# Patient Record
Sex: Male | Born: 1990 | Race: White | Hispanic: No | Marital: Single | Smoking: Current every day smoker
Health system: Southern US, Community
[De-identification: ages and names within clinical notes are randomized; demographics above are authoritative.]

## PROBLEM LIST (undated history)

## (undated) DIAGNOSIS — R569 Unspecified convulsions: Secondary | ICD-10-CM

## (undated) DIAGNOSIS — I10 Essential (primary) hypertension: Secondary | ICD-10-CM

---
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
## 2011-03-26 ENCOUNTER — Encounter (HOSPITAL_COMMUNITY): Payer: Self-pay | Admitting: Emergency Medicine

## 2011-03-26 ENCOUNTER — Emergency Department (HOSPITAL_COMMUNITY): Payer: BC Managed Care – PPO

## 2011-03-26 ENCOUNTER — Emergency Department (HOSPITAL_COMMUNITY)
Admission: EM | Admit: 2011-03-26 | Discharge: 2011-03-26 | Disposition: A | Discharge status: Home / Self Care | Payer: BC Managed Care – PPO | Attending: Emergency Medicine | Admitting: Emergency Medicine

## 2011-03-26 DIAGNOSIS — R569 Unspecified convulsions: Secondary | ICD-10-CM | POA: Insufficient documentation

## 2011-03-26 DIAGNOSIS — F29 Unspecified psychosis not due to a substance or known physiological condition: Secondary | ICD-10-CM | POA: Insufficient documentation

## 2011-03-26 DIAGNOSIS — R404 Transient alteration of awareness: Secondary | ICD-10-CM | POA: Insufficient documentation

## 2011-03-26 LAB — CBC
HCT: 48.2 % (ref 39.0–52.0)
Hemoglobin: 16.9 g/dL (ref 13.0–17.0)
RBC: 5.47 MIL/uL (ref 4.22–5.81)

## 2011-03-26 LAB — URINALYSIS, ROUTINE W REFLEX MICROSCOPIC
Glucose, UA: NEGATIVE mg/dL
Ketones, ur: NEGATIVE mg/dL
Leukocytes, UA: NEGATIVE
Protein, ur: NEGATIVE mg/dL
Urobilinogen, UA: 1 mg/dL (ref 0.0–1.0)

## 2011-03-26 LAB — COMPREHENSIVE METABOLIC PANEL
Albumin: 4 g/dL (ref 3.5–5.2)
Alkaline Phosphatase: 57 U/L (ref 39–117)
BUN: 8 mg/dL (ref 6–23)
CO2: 27 mEq/L (ref 19–32)
Chloride: 104 mEq/L (ref 96–112)
GFR calc Af Amer: >90 mL/min (ref >90)
GFR calc non Af Amer: >90 mL/min (ref >90)
Glucose, Bld: 79 mg/dL (ref 70–99)
Potassium: 4 mEq/L (ref 3.5–5.1)
Total Bilirubin: 0.8 mg/dL (ref 0.3–1.2)

## 2011-03-26 LAB — DIFFERENTIAL
Lymphocytes Relative: 7 % — ABNORMAL LOW (ref 12–46)
Lymphs Abs: 1.1 10*3/uL (ref 0.7–4.0)
Monocytes Absolute: 1.2 10*3/uL — ABNORMAL HIGH (ref 0.1–1.0)
Monocytes Relative: 8 % (ref 3–12)
Neutro Abs: 13.7 10*3/uL — ABNORMAL HIGH (ref 1.7–7.7)
Neutrophils Relative %: 84 % — ABNORMAL HIGH (ref 43–77)

## 2011-03-26 LAB — ETHANOL: Alcohol, Ethyl (B): <11 mg/dL (ref 0–11)

## 2011-03-26 LAB — RAPID URINE DRUG SCREEN, HOSP PERFORMED
Amphetamines: NOT DETECTED
Tetrahydrocannabinol: POSITIVE — AB

## 2011-03-26 NOTE — ED Notes (Signed)
Patient states that he is ready to go home. Denies pain.

## 2011-03-26 NOTE — ED Provider Notes (Signed)
History     CSN: [id number] Arrival date & time: 03/26/2011  2:52 PM   First MD Initiated Contact with Patient 03/26/11 1518      Chief Complaint  Patient presents with  . Seizures    (Consider location/radiation/quality/duration/timing/severity/associated sxs/prior treatment) HPI Comments: Was raking leaves at work, then suddenly went into a seizure.  No bowel or bladder incontinence.  No biting of tongue or cheek.  Patient is a 20 y.o. male presenting with seizures. The history is provided by the patient.  Seizures  This is a new problem. The current episode started less than 1 hour ago. The problem has been resolved. There was 1 seizure. The most recent episode lasted 2 to 5 minutes. Associated symptoms include confusion. Pertinent negatives include no headaches, no neck stiffness, no chest pain and no cough. Characteristics include rhythmic jerking and loss of consciousness. Characteristics do not include bladder incontinence. The episode was witnessed. There was no sensation of an aura present. The seizures did not continue in the ED. The seizure(s) had no focality. There has been no fever.    History reviewed. No pertinent past medical history.  No past surgical history on file.  No family history on file.  History  Substance Use Topics  . Smoking status: Not on file  . Smokeless tobacco: Not on file  . Alcohol Use: Not on file      Review of Systems  Respiratory: Negative for cough.   Cardiovascular: Negative for chest pain.  Genitourinary: Negative for bladder incontinence.  Neurological: Positive for seizures and loss of consciousness. Negative for headaches.  Psychiatric/Behavioral: Positive for confusion.  All other systems reviewed and are negative.    Allergies  Review of patient's allergies indicates no known allergies.  Home Medications  No current outpatient prescriptions on file.  BP 112/74  Pulse 105  Resp 22  SpO2 100%  Physical Exam    Nursing note and vitals reviewed. Constitutional: He is oriented to person, place, and time. He appears well-developed and well-nourished. No distress.  HENT:  Head: Normocephalic and atraumatic.  Right Ear: External ear normal.  Mouth/Throat: No oropharyngeal exudate.       No lacs on tongue or cheek.  Eyes: EOM are normal. Pupils are equal, round, and reactive to light.  Neck: Normal range of motion. Neck supple.  Cardiovascular: Normal rate and regular rhythm.  Exam reveals no gallop and no friction rub.   No murmur heard. Pulmonary/Chest: Effort normal and breath sounds normal. No respiratory distress.  Abdominal: Soft. Bowel sounds are normal. There is no tenderness.  Musculoskeletal: Normal range of motion.  Lymphadenopathy:    He has no cervical adenopathy.  Neurological: He is alert and oriented to person, place, and time. No cranial nerve deficit. Coordination normal.  Skin: Skin is warm and dry. He is not diaphoretic.    ED Course  Procedures (including critical care time)   Labs Reviewed  CBC  DIFFERENTIAL  COMPREHENSIVE METABOLIC PANEL  URINALYSIS, ROUTINE W REFLEX MICROSCOPIC  URINE RAPID DRUG SCREEN (HOSP PERFORMED)  ETHANOL   No results found.   No diagnosis found.    MDM  Patient with first time seizure.  Labs, CT okay.  Spoke with [facility] and they agree to follow him up in the next few days.        Geoffery Lyons, MD 03/26/11 [id number]

## 2011-03-26 NOTE — ED Notes (Signed)
Per EMS:  Pt here s/p seizure.  Pt was at work raking leaves when he was described as lying on the ground having a full body seizure.  Witnesses report activity lasted 2-3 minutes.  Upon arrival of ems pt was alert and confused.  Upon arrival here pt is now alert and oriented x4.  Pt has no hx of seizure.  Pt has abrasion to left elbow.

## 2011-04-03 LAB — THC (MARIJUANA), URINE, CONFIRMATION: Marijuana, Ur-Confirmation: 595 NG/ML — ABNORMAL HIGH

## 2012-07-10 ENCOUNTER — Emergency Department: Payer: Self-pay | Admitting: Emergency Medicine

## 2012-07-10 LAB — CBC
HCT: 56.6 % — ABNORMAL HIGH (ref 40.0–52.0)
HGB: 18.4 g/dL — ABNORMAL HIGH (ref 13.0–18.0)
RBC: 6.27 10*6/uL — ABNORMAL HIGH (ref 4.40–5.90)
RDW: 13.5 % (ref 11.5–14.5)
WBC: 14.2 10*3/uL — ABNORMAL HIGH (ref 3.8–10.6)

## 2012-07-10 LAB — BASIC METABOLIC PANEL
EGFR (African American): >60
Osmolality: 275 (ref 275–301)
Potassium: 3.6 mmol/L (ref 3.5–5.1)

## 2012-07-10 LAB — URINALYSIS, COMPLETE
Glucose,UR: NEGATIVE mg/dL (ref 0–75)
Specific Gravity: 1.003 (ref 1.003–1.030)

## 2012-09-28 ENCOUNTER — Emergency Department: Payer: Self-pay | Admitting: Emergency Medicine

## 2012-09-28 LAB — URINALYSIS, COMPLETE
Bacteria: NONE SEEN
Bilirubin,UR: NEGATIVE
Blood: NEGATIVE
Ketone: NEGATIVE
Leukocyte Esterase: NEGATIVE
Nitrite: NEGATIVE
Protein: NEGATIVE
RBC,UR: <1 /HPF (ref 0–5)
Squamous Epithelial: NONE SEEN
WBC UR: 1 /HPF (ref 0–5)

## 2012-09-28 LAB — BASIC METABOLIC PANEL
Anion Gap: 3 — ABNORMAL LOW (ref 7–16)
Chloride: 104 mmol/L (ref 98–107)
Creatinine: 0.82 mg/dL (ref 0.60–1.30)
EGFR (African American): >60
Potassium: 4.3 mmol/L (ref 3.5–5.1)

## 2012-09-28 LAB — CBC
HGB: 17.7 g/dL (ref 13.0–18.0)
MCV: 88 fL (ref 80–100)
RDW: 14.5 % (ref 11.5–14.5)
WBC: 8.2 10*3/uL (ref 3.8–10.6)

## 2013-04-24 ENCOUNTER — Emergency Department: Payer: Self-pay | Admitting: Emergency Medicine

## 2013-05-01 ENCOUNTER — Emergency Department: Payer: Self-pay | Admitting: Emergency Medicine

## 2013-06-15 ENCOUNTER — Emergency Department: Payer: Self-pay | Admitting: Emergency Medicine

## 2013-06-19 ENCOUNTER — Emergency Department: Payer: Self-pay | Admitting: Emergency Medicine

## 2013-06-19 LAB — CBC WITH DIFFERENTIAL/PLATELET
BASOS PCT: 0.8 %
Basophil #: 0.1 10*3/uL (ref 0.0–0.1)
EOS PCT: 0.9 %
Eosinophil #: 0.1 10*3/uL (ref 0.0–0.7)
HCT: 48.9 % (ref 40.0–52.0)
HGB: 16.5 g/dL (ref 13.0–18.0)
LYMPHS PCT: 9.6 %
Lymphocyte #: 0.9 10*3/uL — ABNORMAL LOW (ref 1.0–3.6)
MCH: 29.6 pg (ref 26.0–34.0)
MCHC: 33.8 g/dL (ref 32.0–36.0)
MCV: 87 fL (ref 80–100)
Monocyte #: 0.9 x10 3/mm (ref 0.2–1.0)
Monocyte %: 10.2 %
NEUTROS PCT: 78.5 %
Neutrophil #: 7.3 10*3/uL — ABNORMAL HIGH (ref 1.4–6.5)
Platelet: 215 10*3/uL (ref 150–440)
RBC: 5.59 10*6/uL (ref 4.40–5.90)
RDW: 13.6 % (ref 11.5–14.5)
WBC: 9.3 10*3/uL (ref 3.8–10.6)

## 2013-06-19 LAB — URINALYSIS, COMPLETE
BILIRUBIN, UR: NEGATIVE
BLOOD: NEGATIVE
Bacteria: NONE SEEN
Glucose,UR: NEGATIVE mg/dL (ref 0–75)
Hyaline Cast: 1
Ketone: NEGATIVE
LEUKOCYTE ESTERASE: NEGATIVE
NITRITE: NEGATIVE
Ph: 9 (ref 4.5–8.0)
RBC,UR: NONE SEEN /HPF (ref 0–5)
Specific Gravity: 1 (ref 1.003–1.030)
WBC UR: <1 /HPF (ref 0–5)

## 2013-06-19 LAB — LIPASE, BLOOD: Lipase: 56 U/L — ABNORMAL LOW (ref 73–393)

## 2013-06-19 LAB — COMPREHENSIVE METABOLIC PANEL
ALBUMIN: 3.8 g/dL (ref 3.4–5.0)
ALT: 17 U/L (ref 12–78)
ANION GAP: 5 — AB (ref 7–16)
AST: 10 U/L — AB (ref 15–37)
Alkaline Phosphatase: 61 U/L
BILIRUBIN TOTAL: 0.6 mg/dL (ref 0.2–1.0)
BUN: 8 mg/dL (ref 7–18)
CO2: 30 mmol/L (ref 21–32)
CREATININE: 0.74 mg/dL (ref 0.60–1.30)
Calcium, Total: 9.3 mg/dL (ref 8.5–10.1)
Chloride: 103 mmol/L (ref 98–107)
EGFR (African American): >60
Glucose: 101 mg/dL — ABNORMAL HIGH (ref 65–99)
Osmolality: 274 (ref 275–301)
Potassium: 4 mmol/L (ref 3.5–5.1)
Sodium: 138 mmol/L (ref 136–145)
TOTAL PROTEIN: 7.4 g/dL (ref 6.4–8.2)

## 2013-06-20 LAB — WOUND CULTURE

## 2013-06-22 ENCOUNTER — Emergency Department: Payer: Self-pay | Admitting: Emergency Medicine

## 2013-06-27 ENCOUNTER — Emergency Department: Payer: Self-pay | Admitting: Internal Medicine

## 2013-06-27 LAB — COMPREHENSIVE METABOLIC PANEL
ALK PHOS: 62 U/L
ALT: 13 U/L (ref 12–78)
ANION GAP: 4 — AB (ref 7–16)
AST: 16 U/L (ref 15–37)
Albumin: 3 g/dL — ABNORMAL LOW (ref 3.4–5.0)
BUN: 9 mg/dL (ref 7–18)
Bilirubin,Total: 0.3 mg/dL (ref 0.2–1.0)
CHLORIDE: 105 mmol/L (ref 98–107)
CO2: 28 mmol/L (ref 21–32)
Calcium, Total: 8.4 mg/dL — ABNORMAL LOW (ref 8.5–10.1)
Creatinine: 0.9 mg/dL (ref 0.60–1.30)
EGFR (African American): >60
EGFR (Non-African Amer.): >60
GLUCOSE: 81 mg/dL (ref 65–99)
Osmolality: 272 (ref 275–301)
Potassium: 4 mmol/L (ref 3.5–5.1)
Sodium: 137 mmol/L (ref 136–145)
Total Protein: 6.7 g/dL (ref 6.4–8.2)

## 2013-06-27 LAB — LIPASE, BLOOD: LIPASE: 51 U/L — AB (ref 73–393)

## 2013-06-27 LAB — CBC WITH DIFFERENTIAL/PLATELET
Basophil #: 0 10*3/uL (ref 0.0–0.1)
Basophil %: 0.4 %
Eosinophil #: 0.6 10*3/uL (ref 0.0–0.7)
Eosinophil %: 8.4 %
HCT: 44.8 % (ref 40.0–52.0)
HGB: 15.5 g/dL (ref 13.0–18.0)
Lymphocyte #: 0.7 10*3/uL — ABNORMAL LOW (ref 1.0–3.6)
Lymphocyte %: 9.8 %
MCH: 29.7 pg (ref 26.0–34.0)
MCHC: 34.6 g/dL (ref 32.0–36.0)
MCV: 86 fL (ref 80–100)
MONO ABS: 0.6 x10 3/mm (ref 0.2–1.0)
Monocyte %: 9.1 %
NEUTROS ABS: 4.8 10*3/uL (ref 1.4–6.5)
NEUTROS PCT: 72.3 %
Platelet: 200 10*3/uL (ref 150–440)
RBC: 5.22 10*6/uL (ref 4.40–5.90)
RDW: 13.5 % (ref 11.5–14.5)
WBC: 6.7 10*3/uL (ref 3.8–10.6)

## 2013-11-08 ENCOUNTER — Emergency Department: Payer: Self-pay | Admitting: Emergency Medicine

## 2014-05-15 IMAGING — CR DG SHOULDER 3+V*R*
1 series · 5 of 5 positions shown · non-contrast
Comparison: None.

CLINICAL DATA: Right shoulder pain post lifting

EXAM:
DG SHOULDER 3+ VIEWS RIGHT

[Series 1: w shoulder grashey right · 0.14mm/px · 5 of 5 slices shown]
[im 1/5]
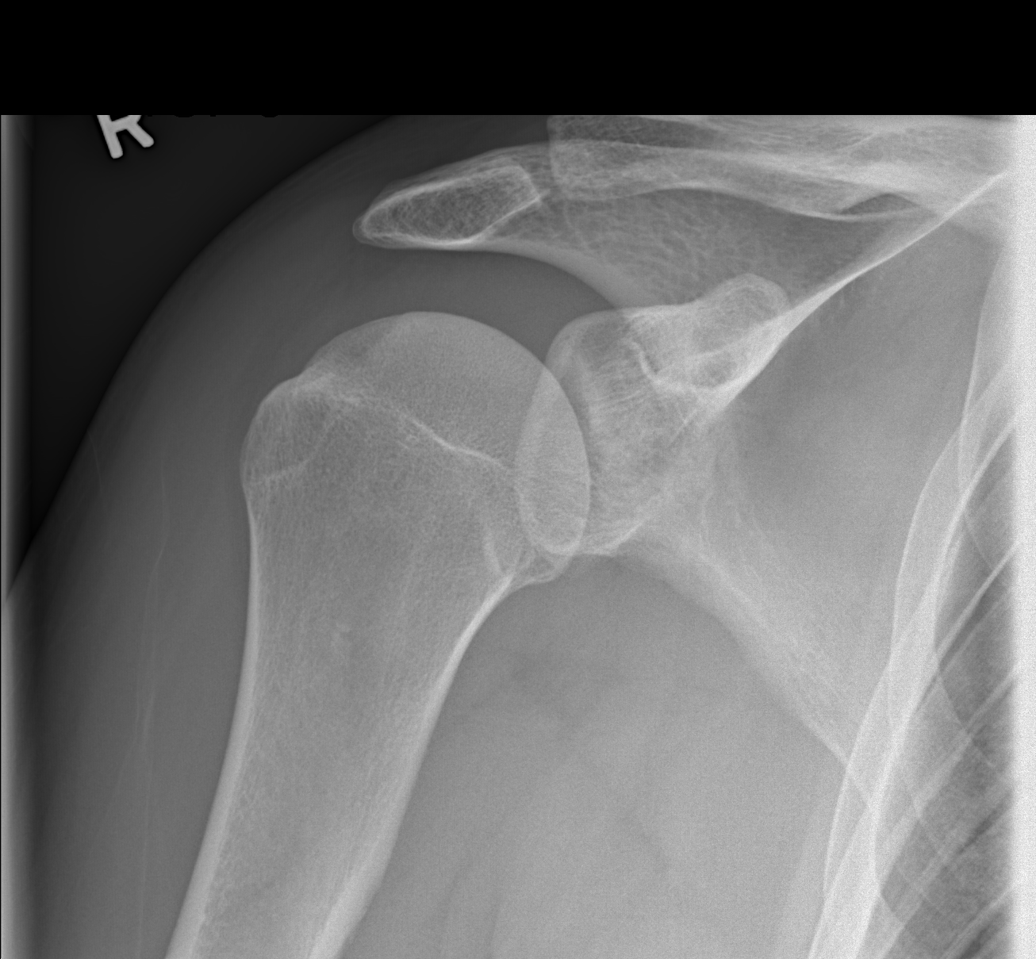
[im 2/5]
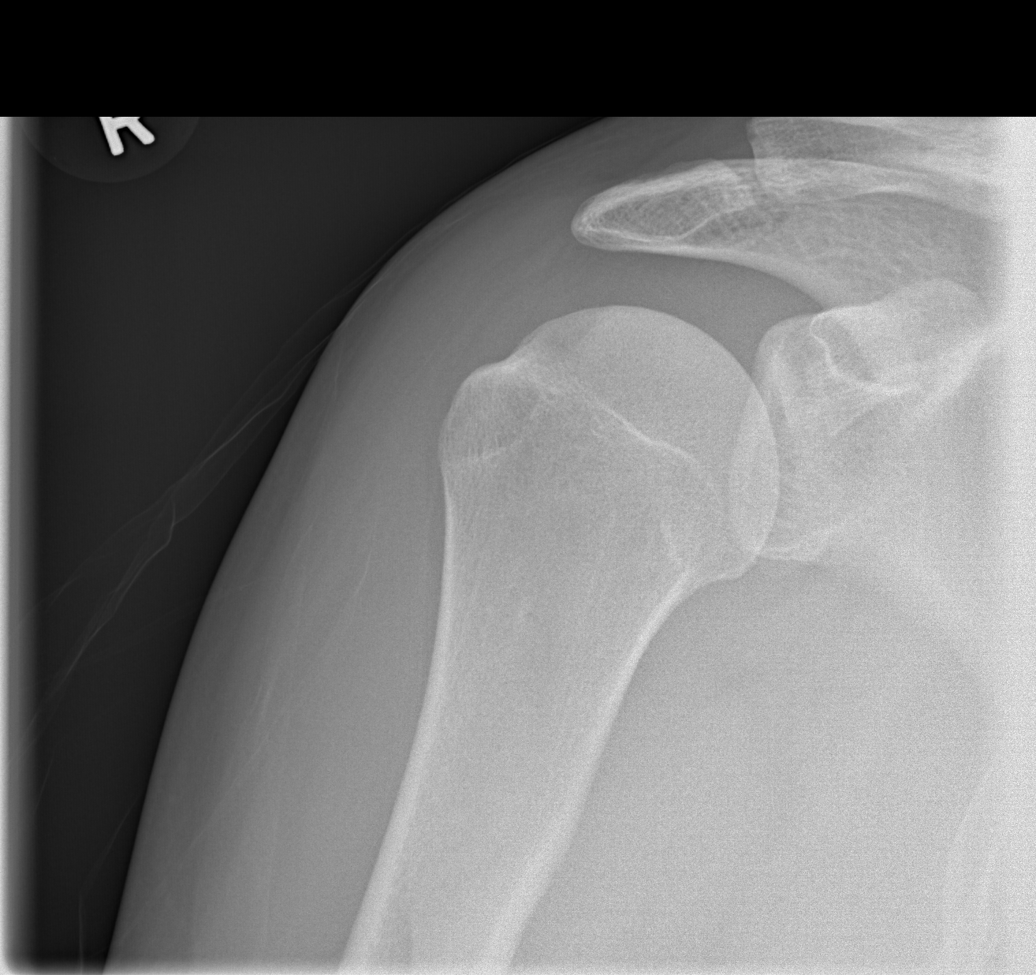
[im 3/5]
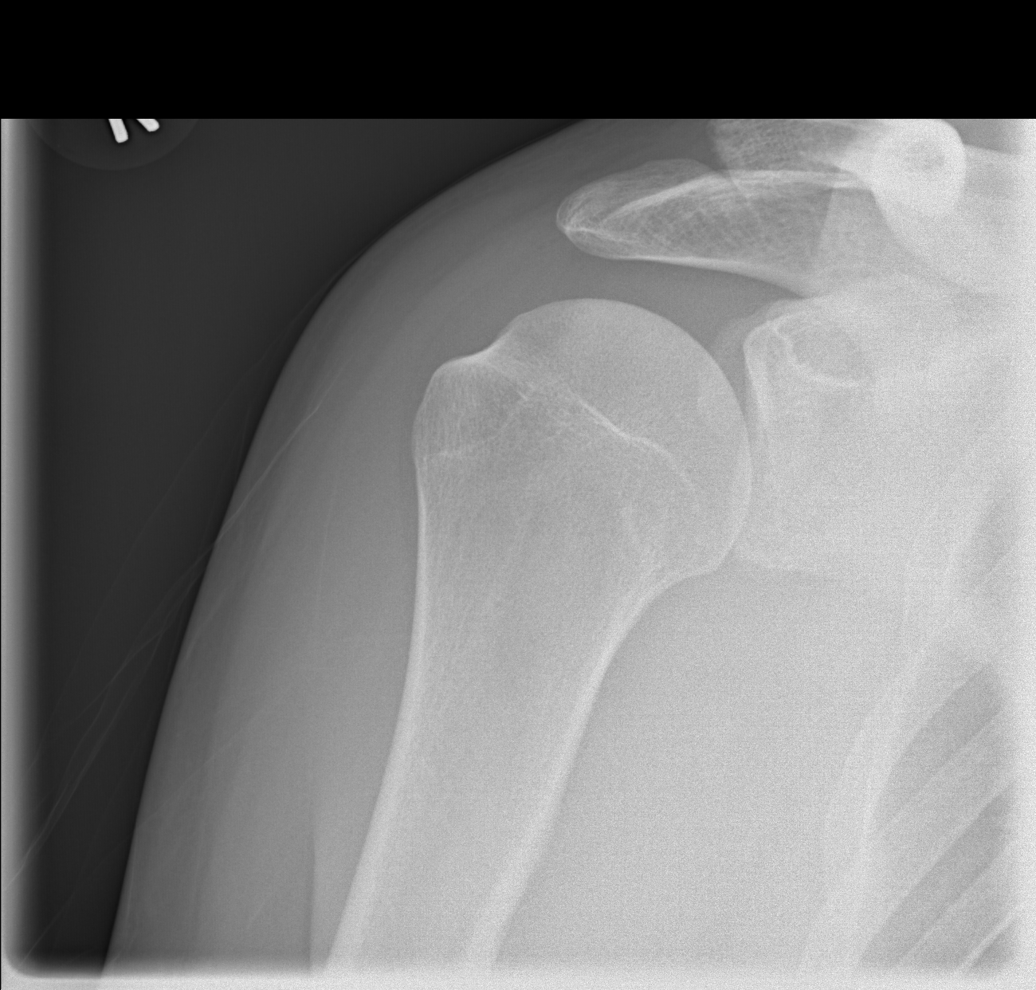
[im 4/5]
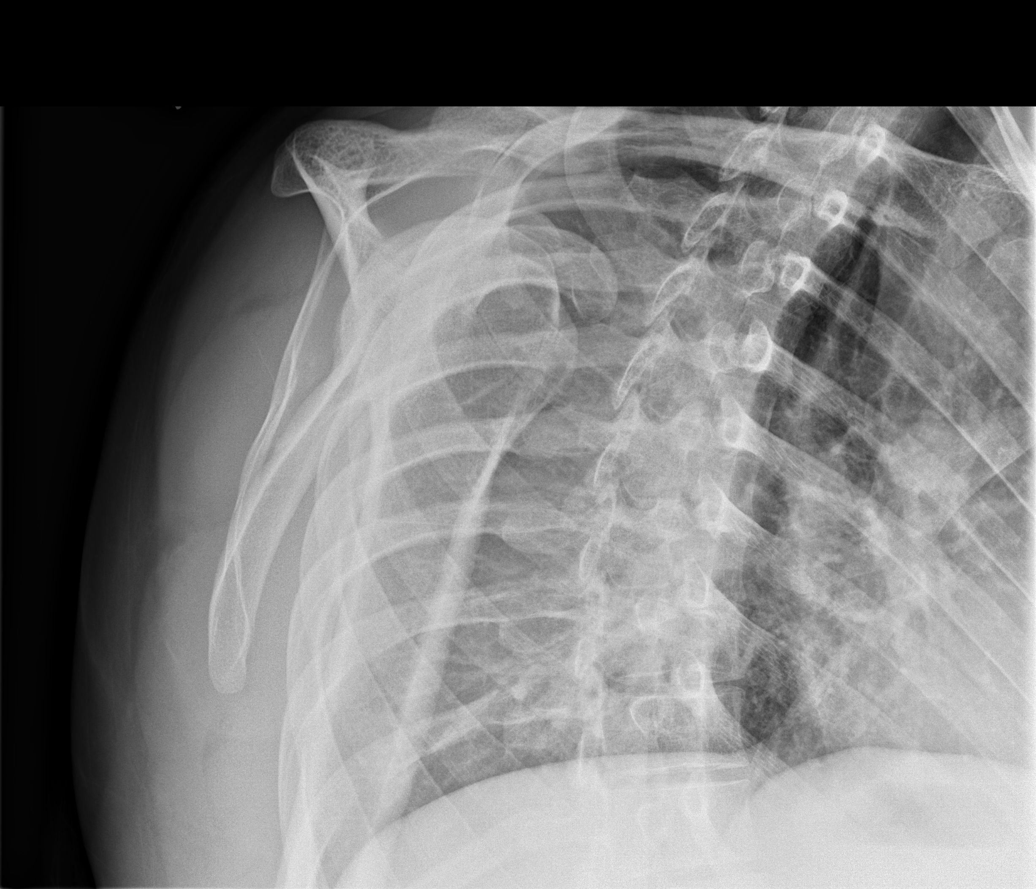
[im 5/5]
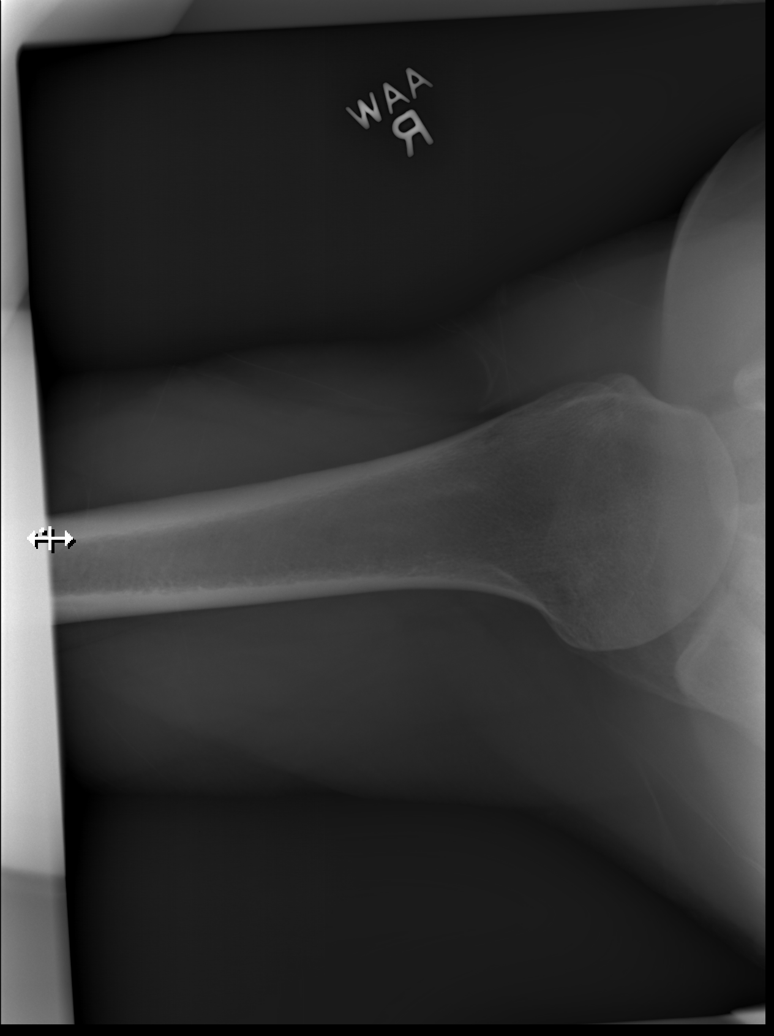

[5 of 5 positions shown; findings below may reference images not displayed]

FINDINGS: There is no evidence of fracture or dislocation. There is no
evidence of arthropathy or other focal bone abnormality. Soft
tissues are unremarkable.
IMPRESSION: Negative.

## 2014-06-11 ENCOUNTER — Emergency Department: Payer: Self-pay | Admitting: Emergency Medicine

## 2014-06-18 LAB — WOUND CULTURE

## 2015-09-03 ENCOUNTER — Encounter: Payer: Self-pay | Admitting: Medical Oncology

## 2015-09-03 ENCOUNTER — Emergency Department
Admission: EM | Admit: 2015-09-03 | Discharge: 2015-09-03 | Disposition: A | Discharge status: Home / Self Care | Payer: Self-pay | Attending: Emergency Medicine | Admitting: Emergency Medicine

## 2015-09-03 ENCOUNTER — Emergency Department: Payer: Self-pay

## 2015-09-03 DIAGNOSIS — Y999 Unspecified external cause status: Secondary | ICD-10-CM | POA: Insufficient documentation

## 2015-09-03 DIAGNOSIS — I10 Essential (primary) hypertension: Secondary | ICD-10-CM | POA: Insufficient documentation

## 2015-09-03 DIAGNOSIS — Y939 Activity, unspecified: Secondary | ICD-10-CM | POA: Insufficient documentation

## 2015-09-03 DIAGNOSIS — S93401A Sprain of unspecified ligament of right ankle, initial encounter: Secondary | ICD-10-CM | POA: Insufficient documentation

## 2015-09-03 DIAGNOSIS — W1842XA Slipping, tripping and stumbling without falling due to stepping into hole or opening, initial encounter: Secondary | ICD-10-CM | POA: Insufficient documentation

## 2015-09-03 DIAGNOSIS — J069 Acute upper respiratory infection, unspecified: Secondary | ICD-10-CM | POA: Insufficient documentation

## 2015-09-03 DIAGNOSIS — Y929 Unspecified place or not applicable: Secondary | ICD-10-CM | POA: Insufficient documentation

## 2015-09-03 HISTORY — DX: Essential (primary) hypertension: I10

## 2015-09-03 HISTORY — DX: Unspecified convulsions: R56.9

## 2015-09-03 MED ORDER — AZITHROMYCIN 250 MG PO TABS: ORAL_TABLET | ORAL | Status: DC

## 2015-09-03 MED ORDER — HYDROCODONE-ACETAMINOPHEN 5-325 MG PO TABS: 1.0000 | ORAL_TABLET | ORAL | Status: DC | PRN

## 2015-09-03 MED ORDER — IBUPROFEN 800 MG PO TABS: 800.0000 mg | ORAL_TABLET | Freq: Three times a day (TID) | ORAL | Status: DC | PRN

## 2015-09-03 MED ORDER — GUAIFENESIN-CODEINE 100-10 MG/5ML PO SOLN: 10.0000 mL | ORAL | Status: DC | PRN

## 2015-09-03 MED ORDER — PSEUDOEPHEDRINE HCL 60 MG PO TABS: 60.0000 mg | ORAL_TABLET | ORAL | Status: DC | PRN

## 2015-09-03 MED ORDER — HYDROCODONE-ACETAMINOPHEN 5-325 MG PO TABS
2.0000 | ORAL_TABLET | Freq: Once | ORAL | Status: AC
  Administered 2015-09-03: 2 via ORAL
  Filled 2015-09-03: qty 2

## 2015-09-03 MED ORDER — KETOROLAC TROMETHAMINE 60 MG/2ML IM SOLN
60.0000 mg | Freq: Once | INTRAMUSCULAR | Status: AC
  Administered 2015-09-03: 60 mg via INTRAMUSCULAR
  Filled 2015-09-03: qty 2

## 2015-09-03 NOTE — ED Provider Notes (Signed)
[facility] Emergency Department Provider Note  ____________________________________________  Time seen: Approximately 4:14 PM  I have reviewed the triage vital signs and the nursing notes.   HISTORY  Chief Complaint Ankle Injury and URI    HPI Garrett Adams is a 25 y.o. male who presents for evaluation of twisted his right ankle last night. In addition states he's been having some cold symptoms would last 3-4 days.Low-grade fever on and off currently 99.3. States pain is ankle is 10 over 10 nonradiating. Denies any numbness or tingling. States that he twisted his ankle by stepping in a hole. No relief with over-the-counter medications.   Past Medical History  Diagnosis Date  . Hypertension   . Seizures (HCC)     There are no active problems to display for this patient.   History reviewed. No pertinent past surgical history.  Current Outpatient Rx  Name  Route  Sig  Dispense  Refill  . azithromycin (ZITHROMAX Z-PAK) 250 MG tablet      Take 2 tablets (500 mg) on  Day 1,  followed by 1 tablet (250 mg) once daily on Days 2 through 5.   6 each   0   . guaiFENesin-codeine 100-10 MG/5ML syrup   Oral   Take 10 mLs by mouth every 4 (four) hours as needed for cough.   180 mL   0   . HYDROcodone-acetaminophen (NORCO) 5-325 MG tablet   Oral   Take 1-2 tablets by mouth every 4 (four) hours as needed for moderate pain.   15 tablet   0   . ibuprofen (ADVIL,MOTRIN) 800 MG tablet   Oral   Take 1 tablet (800 mg total) by mouth every 8 (eight) hours as needed.   30 tablet   0   . pseudoephedrine (SUDAFED) 60 MG tablet   Oral   Take 1 tablet (60 mg total) by mouth every 4 (four) hours as needed for congestion.   24 tablet   0     Allergies Review of patient's allergies indicates no known allergies.  No family history on file.  Social History Social History  Substance Use Topics  . Smoking status: None  . Smokeless tobacco: None  .  Alcohol Use: None    Review of Systems Constitutional: Positive fever/chills Eyes: No visual changes. ENT: Positive Cardiovascular: Denies chest pain. Respiratory: Denies shortness of breath. Positive productive cough Gastrointestinal: No abdominal pain.  No nausea, no vomiting.  No diarrhea.  No constipation. Genitourinary: Negative for dysuria. Musculoskeletal: Positive for severe right ankle pain Skin: Negative for rash. Neurological: Negative for headaches, focal weakness or numbness.  10-point ROS otherwise negative.  ____________________________________________   PHYSICAL EXAM:  VITAL SIGNS: ED Triage Vitals  Enc Vitals Group     BP 09/03/15 1603 125/77 mmHg     Pulse Rate 09/03/15 1602 101     Resp 09/03/15 1602 20     Temp 09/03/15 1602 99.3 F (37.4 C)     Temp Source 09/03/15 1602 Oral     SpO2 09/03/15 1602 98 %     Weight 09/03/15 1602 205 lb (92.987 kg)     Height 09/03/15 1602 6' (1.829 m)     Head Cir --      Peak Flow --      Pain Score 09/03/15 1602 10     Pain Loc --      Pain Edu? --      Excl. in GC? --  Constitutional: Alert and oriented. Well appearing and in no acute distress. Eyes: Conjunctivae are normal. PERRL. EOMI. Head: Atraumatic. Nose: Positive congestion/rhinnorhea. Anterior turbinates swollen bilaterally. Mouth/Throat: Mucous membranes are moist.  Oropharynx non-erythematous. Neck: No stridor.  No cervical adenopathy full range of motion. Cardiovascular: Normal rate, regular rhythm. Grossly normal heart sounds.  Good peripheral circulation. Respiratory: Normal respiratory effort.  No retractions. Lungs coarse breath sounds noted bilaterally Musculoskeletal: Right ankle with 2+ lateral malleolus swelling. Distally neurovascularly intact. Neurologic:  Normal speech and language. No gross focal neurologic deficits are appreciated. No gait instability. Skin:  Skin is warm, dry and intact. No rash noted. Psychiatric: Mood and affect  are normal. Speech and behavior are normal.  ____________________________________________   LABS (all labs ordered are listed, but only abnormal results are displayed)  Labs Reviewed - No data to display ____________________________________________   RADIOLOGY  IMPRESSION: Soft tissue swelling with no No acute fracture or dislocation identified about the right ankle. ____________________________________________   PROCEDURES  Procedure(s) performed: None  Critical Care performed: No  ____________________________________________   INITIAL IMPRESSION / ASSESSMENT AND PLAN / ED COURSE  Pertinent labs & imaging results that were available during my care of the patient were reviewed by me and considered in my medical decision making (see chart for details).  Acute right ankle sprain. Acute upper respiratory infection. Rx given for Vicodin 5/325 and ibuprofen. Patient placed in a stirrup splint. In addition he started on Zithromax, Robitussin-AC and Sudafed for his upper respiratory infection. Rx excuse given 3 days. Follow up PCP or return to the ER as needed. ____________________________________________   FINAL CLINICAL IMPRESSION(S) / ED DIAGNOSES  Final diagnoses:  Acute URI  Ankle sprain, right, initial encounter     This chart was dictated using voice recognition software/Dragon. Despite best efforts to proofread, errors can occur which can change the meaning. Any change was purely unintentional.   Evangeline Dakinharles M Lajuana Patchell, PA-C 09/03/15 1720  Emily FilbertJonathan E Williams, MD 09/03/15 [phone]

## 2015-09-03 NOTE — Discharge Instructions (Signed)
Ankle Sprain °An ankle sprain is an injury to the strong, fibrous tissues (ligaments) that hold the bones of your ankle joint together.  °CAUSES °An ankle sprain is usually caused by a fall or by twisting your ankle. Ankle sprains most commonly occur when you step on the outer edge of your foot, and your ankle turns inward. People who participate in sports are more prone to these types of injuries.  °SYMPTOMS  °· Pain in your ankle. The pain may be present at rest or only when you are trying to stand or walk. °· Swelling. °· Bruising. Bruising may develop immediately or within 1 to 2 days after your injury. °· Difficulty standing or walking, particularly when turning corners or changing directions. °DIAGNOSIS  °Your caregiver will ask you details about your injury and perform a physical exam of your ankle to determine if you have an ankle sprain. During the physical exam, your caregiver will press on and apply pressure to specific areas of your foot and ankle. Your caregiver will try to move your ankle in certain ways. An X-ray exam may be done to be sure a bone was not broken or a ligament did not separate from one of the bones in your ankle (avulsion fracture).  °TREATMENT  °Certain types of braces can help stabilize your ankle. Your caregiver can make a recommendation for this. Your caregiver may recommend the use of medicine for pain. If your sprain is severe, your caregiver may refer you to a surgeon who helps to restore function to parts of your skeletal system (orthopedist) or a physical therapist. °HOME CARE INSTRUCTIONS  °· Apply ice to your injury for 1-2 days or as directed by your caregiver. Applying ice helps to reduce inflammation and pain. °· Put ice in a plastic bag. °· Place a towel between your skin and the bag. °· Leave the ice on for 15-20 minutes at a time, every 2 hours while you are awake. °· Only take over-the-counter or prescription medicines for pain, discomfort, or fever as directed by  your caregiver. °· Elevate your injured ankle above the level of your heart as much as possible for 2-3 days. °· If your caregiver recommends crutches, use them as instructed. Gradually put weight on the affected ankle. Continue to use crutches or a cane until you can walk without feeling pain in your ankle. °· If you have a plaster splint, wear the splint as directed by your caregiver. Do not rest it on anything harder than a pillow for the first 24 hours. Do not put weight on it. Do not get it wet. You may take it off to take a shower or bath. °· You may have been given an elastic bandage to wear around your ankle to provide support. If the elastic bandage is too tight (you have numbness or tingling in your foot or your foot becomes cold and blue), adjust the bandage to make it comfortable. °· If you have an air splint, you may blow more air into it or let air out to make it more comfortable. You may take your splint off at night and before taking a shower or bath. Wiggle your toes in the splint several times per day to decrease swelling. °SEEK MEDICAL CARE IF:  °· You have rapidly increasing bruising or swelling. °· Your toes feel extremely cold or you lose feeling in your foot. °· Your pain is not relieved with medicine. °SEEK IMMEDIATE MEDICAL CARE IF: °· Your toes are numb or blue. °·   You have severe pain that is increasing. MAKE SURE YOU:   Understand these instructions.  Will watch your condition.  Will get help right away if you are not doing well or get worse.   This information is not intended to replace advice given to you by your health care provider. Make sure you discuss any questions you have with your health care provider.   Document Released: 05/03/2005 Document Revised: 05/24/2014 Document Reviewed: 05/15/2011 Elsevier Interactive Patient Education 2016 [employer] Inc[employer]  Upper Respiratory Infection, Adult Most upper respiratory infections (URIs) are a viral infection of the air  passages leading to the lungs. A URI affects the nose, throat, and upper air passages. The most common type of URI is nasopharyngitis and is typically referred to as "the common cold." URIs run their course and usually go away on their own. Most of the time, a URI does not require medical attention, but sometimes a bacterial infection in the upper airways can follow a viral infection. This is called a secondary infection. Sinus and middle ear infections are common types of secondary upper respiratory infections. Bacterial pneumonia can also complicate a URI. A URI can worsen asthma and chronic obstructive pulmonary disease (COPD). Sometimes, these complications can require emergency medical care and may be life threatening.  CAUSES Almost all URIs are caused by viruses. A virus is a type of germ and can spread from one person to another.  RISKS FACTORS You may be at risk for a URI if:   You smoke.   You have chronic heart or lung disease.  You have a weakened defense (immune) system.   You are very young or very old.   You have nasal allergies or asthma.  You work in crowded or poorly ventilated areas.  You work in health care facilities or schools. SIGNS AND SYMPTOMS  Symptoms typically develop 2-3 days after you come in contact with a cold virus. Most viral URIs last 7-10 days. However, viral URIs from the influenza virus (flu virus) can last 14-18 days and are typically more severe. Symptoms may include:   Runny or stuffy (congested) nose.   Sneezing.   Cough.   Sore throat.   Headache.   Fatigue.   Fever.   Loss of appetite.   Pain in your forehead, behind your eyes, and over your cheekbones (sinus pain).  Muscle aches.  DIAGNOSIS  Your health care provider may diagnose a URI by:  Physical exam.  Tests to check that your symptoms are not due to another condition such as:  Strep throat.  Sinusitis.  Pneumonia.  Asthma. TREATMENT  A URI goes  away on its own with time. It cannot be cured with medicines, but medicines may be prescribed or recommended to relieve symptoms. Medicines may help:  Reduce your fever.  Reduce your cough.  Relieve nasal congestion. HOME CARE INSTRUCTIONS   Take medicines only as directed by your health care provider.   Gargle warm saltwater or take cough drops to comfort your throat as directed by your health care provider.  Use a warm mist humidifier or inhale steam from a shower to increase air moisture. This may make it easier to breathe.  Drink enough fluid to keep your urine clear or pale yellow.   Eat soups and other clear broths and maintain good nutrition.   Rest as needed.   Return to work when your temperature has returned to normal or as your health care provider advises. You may need to stay home  longer to avoid infecting others. You can also use a face mask and careful hand washing to prevent spread of the virus.  Increase the usage of your inhaler if you have asthma.   Do not use any tobacco products, including cigarettes, chewing tobacco, or electronic cigarettes. If you need help quitting, ask your health care provider. PREVENTION  The best way to protect yourself from getting a cold is to practice good hygiene.   Avoid oral or hand contact with people with cold symptoms.   Wash your hands often if contact occurs.  There is no clear evidence that vitamin C, vitamin E, echinacea, or exercise reduces the chance of developing a cold. However, it is always recommended to get plenty of rest, exercise, and practice good nutrition.  SEEK MEDICAL CARE IF:   You are getting worse rather than better.   Your symptoms are not controlled by medicine.   You have chills.  You have worsening shortness of breath.  You have brown or red mucus.  You have yellow or brown nasal discharge.  You have pain in your face, especially when you bend forward.  You have a fever.  You  have swollen neck glands.  You have pain while swallowing.  You have white areas in the back of your throat. SEEK IMMEDIATE MEDICAL CARE IF:   You have severe or persistent:  Headache.  Ear pain.  Sinus pain.  Chest pain.  You have chronic lung disease and any of the following:  Wheezing.  Prolonged cough.  Coughing up blood.  A change in your usual mucus.  You have a stiff neck.  You have changes in your:  Vision.  Hearing.  Thinking.  Mood. MAKE SURE YOU:   Understand these instructions.  Will watch your condition.  Will get help right away if you are not doing well or get worse.   This information is not intended to replace advice given to you by your health care provider. Make sure you discuss any questions you have with your health care provider.   Document Released: 10/27/2000 Document Revised: 09/17/2014 Document Reviewed: 08/08/2013 Elsevier Interactive Patient Education [employer]

## 2015-09-03 NOTE — ED Notes (Signed)
Patient transported to x-ray. ?

## 2015-09-03 NOTE — ED Notes (Signed)
Pt reports he twisted his rt ankle last night and has been having pain since. Pt also reports that he is having cold sx's.

## 2015-11-13 ENCOUNTER — Emergency Department: Payer: Self-pay

## 2015-11-13 ENCOUNTER — Encounter: Payer: Self-pay | Admitting: Emergency Medicine

## 2015-11-13 ENCOUNTER — Emergency Department
Admission: EM | Admit: 2015-11-13 | Discharge: 2015-11-13 | Disposition: A | Discharge status: Home / Self Care | Payer: Self-pay | Attending: Emergency Medicine | Admitting: Emergency Medicine

## 2015-11-13 DIAGNOSIS — Y999 Unspecified external cause status: Secondary | ICD-10-CM | POA: Insufficient documentation

## 2015-11-13 DIAGNOSIS — Y939 Activity, unspecified: Secondary | ICD-10-CM | POA: Insufficient documentation

## 2015-11-13 DIAGNOSIS — S93402A Sprain of unspecified ligament of left ankle, initial encounter: Secondary | ICD-10-CM | POA: Insufficient documentation

## 2015-11-13 DIAGNOSIS — Y929 Unspecified place or not applicable: Secondary | ICD-10-CM | POA: Insufficient documentation

## 2015-11-13 DIAGNOSIS — I10 Essential (primary) hypertension: Secondary | ICD-10-CM | POA: Insufficient documentation

## 2015-11-13 DIAGNOSIS — F17200 Nicotine dependence, unspecified, uncomplicated: Secondary | ICD-10-CM | POA: Insufficient documentation

## 2015-11-13 DIAGNOSIS — X58XXXA Exposure to other specified factors, initial encounter: Secondary | ICD-10-CM | POA: Insufficient documentation

## 2015-11-13 DIAGNOSIS — Z79899 Other long term (current) drug therapy: Secondary | ICD-10-CM | POA: Insufficient documentation

## 2015-11-13 MED ORDER — TRAMADOL HCL 50 MG PO TABS: 50.0000 mg | ORAL_TABLET | Freq: Four times a day (QID) | ORAL | Status: DC | PRN

## 2015-11-13 MED ORDER — TRAMADOL HCL 50 MG PO TABS
50.0000 mg | ORAL_TABLET | Freq: Once | ORAL | Status: AC
  Administered 2015-11-13: 50 mg via ORAL

## 2015-11-13 MED ORDER — NAPROXEN 500 MG PO TABS: 500.0000 mg | ORAL_TABLET | Freq: Two times a day (BID) | ORAL | Status: DC

## 2015-11-13 MED ORDER — TRAMADOL HCL 50 MG PO TABS
ORAL_TABLET | ORAL | Status: AC
  Administered 2015-11-13: 50 mg via ORAL
  Filled 2015-11-13: qty 1

## 2015-11-13 NOTE — ED Notes (Signed)
NAD noted at time of D/C. Pt denies questions or concerns. Pt ambulatory to the lobby at this time.  

## 2015-11-13 NOTE — Discharge Instructions (Signed)
Ankle Sprain  An ankle sprain is an injury to the strong, fibrous tissues (ligaments) that hold the bones of your ankle joint together.   CAUSES  An ankle sprain is usually caused by a fall or by twisting your ankle. Ankle sprains most commonly occur when you step on the outer edge of your foot, and your ankle turns inward. People who participate in sports are more prone to these types of injuries.   SYMPTOMS    Pain in your ankle. The pain may be present at rest or only when you are trying to stand or walk.   Swelling.   Bruising. Bruising may develop immediately or within 1 to 2 days after your injury.   Difficulty standing or walking, particularly when turning corners or changing directions.  DIAGNOSIS   Your caregiver will ask you details about your injury and perform a physical exam of your ankle to determine if you have an ankle sprain. During the physical exam, your caregiver will press on and apply pressure to specific areas of your foot and ankle. Your caregiver will try to move your ankle in certain ways. An X-ray exam may be done to be sure a bone was not broken or a ligament did not separate from one of the bones in your ankle (avulsion fracture).   TREATMENT   Certain types of braces can help stabilize your ankle. Your caregiver can make a recommendation for this. Your caregiver may recommend the use of medicine for pain. If your sprain is severe, your caregiver may refer you to a surgeon who helps to restore function to parts of your skeletal system (orthopedist) or a physical therapist.  HOME CARE INSTRUCTIONS    Apply ice to your injury for 1-2 days or as directed by your caregiver. Applying ice helps to reduce inflammation and pain.    Put ice in a plastic bag.    Place a towel between your skin and the bag.    Leave the ice on for 15-20 minutes at a time, every 2 hours while you are awake.   Only take over-the-counter or prescription medicines for pain, discomfort, or fever as directed by  your caregiver.   Elevate your injured ankle above the level of your heart as much as possible for 2-3 days.   If your caregiver recommends crutches, use them as instructed. Gradually put weight on the affected ankle. Continue to use crutches or a cane until you can walk without feeling pain in your ankle.   If you have a plaster splint, wear the splint as directed by your caregiver. Do not rest it on anything harder than a pillow for the first 24 hours. Do not put weight on it. Do not get it wet. You may take it off to take a shower or bath.   You may have been given an elastic bandage to wear around your ankle to provide support. If the elastic bandage is too tight (you have numbness or tingling in your foot or your foot becomes cold and blue), adjust the bandage to make it comfortable.   If you have an air splint, you may blow more air into it or let air out to make it more comfortable. You may take your splint off at night and before taking a shower or bath. Wiggle your toes in the splint several times per day to decrease swelling.  SEEK MEDICAL CARE IF:    You have rapidly increasing bruising or swelling.   Your toes feel   extremely cold or you lose feeling in your foot.   Your pain is not relieved with medicine.  SEEK IMMEDIATE MEDICAL CARE IF:   Your toes are numb or blue.   You have severe pain that is increasing.  MAKE SURE YOU:    Understand these instructions.   Will watch your condition.   Will get help right away if you are not doing well or get worse.     This information is not intended to replace advice given to you by your health care provider. Make sure you discuss any questions you have with your health care provider.     Document Released: 05/03/2005 Document Revised: 05/24/2014 Document Reviewed: 05/15/2011  Elsevier Interactive Patient Education 2016 [employer] Inc[employer]

## 2015-11-13 NOTE — ED Provider Notes (Signed)
[facility] Emergency Department Provider Note ____________________________________________  Time seen: Approximately 1:38 PM  I have reviewed the triage vital signs and the nursing notes.   HISTORY  Chief Complaint Ankle Pain    HPI Garrett Adams is a 25 y.o. male who presents to the emergency department for evaluation of left ankle pain. He stepped off a curb and turned his ankle yesterday. He has had multiple sprains of this ankle, most recently 3 months ago. He has taken "1 ibuprofen" without relief.  Past Medical History  Diagnosis Date  . Hypertension   . Seizures (HCC)     There are no active problems to display for this patient.   History reviewed. No pertinent past surgical history.  Current Outpatient Rx  Name  Route  Sig  Dispense  Refill  . azithromycin (ZITHROMAX Z-PAK) 250 MG tablet      Take 2 tablets (500 mg) on  Day 1,  followed by 1 tablet (250 mg) once daily on Days 2 through 5.   6 each   0   . guaiFENesin-codeine 100-10 MG/5ML syrup   Oral   Take 10 mLs by mouth every 4 (four) hours as needed for cough.   180 mL   0   . naproxen (NAPROSYN) 500 MG tablet   Oral   Take 1 tablet (500 mg total) by mouth 2 (two) times daily with a meal.   30 tablet   0   . pseudoephedrine (SUDAFED) 60 MG tablet   Oral   Take 1 tablet (60 mg total) by mouth every 4 (four) hours as needed for congestion.   24 tablet   0   . traMADol (ULTRAM) 50 MG tablet   Oral   Take 1 tablet (50 mg total) by mouth every 6 (six) hours as needed.   12 tablet   0     Allergies Review of patient's allergies indicates no known allergies.  History reviewed. No pertinent family history.  Social History Social History  Substance Use Topics  . Smoking status: Current Every Day Smoker  . Smokeless tobacco: None  . Alcohol Use: Yes     Comment: occasional    Review of Systems Constitutional: No recent illness. Cardiovascular: Denies chest  pain or palpitations. Respiratory: Denies shortness of breath. Musculoskeletal: Pain in left ankle. Skin: Negative for rash, wound, lesion. Neurological: Negative for focal weakness or numbness.  ____________________________________________   PHYSICAL EXAM:  VITAL SIGNS: ED Triage Vitals  Enc Vitals Group     BP 11/13/15 1204 140/80 mmHg     Pulse Rate 11/13/15 1204 84     Resp 11/13/15 1204 18     Temp 11/13/15 1204 97.9 F (36.6 C)     Temp Source 11/13/15 1204 Oral     SpO2 11/13/15 1204 99 %     Weight 11/13/15 1204 215 lb (97.523 kg)     Height 11/13/15 1204 6' (1.829 m)     Head Cir --      Peak Flow --      Pain Score 11/13/15 1204 10     Pain Loc --      Pain Edu? --      Excl. in GC? --     Constitutional: Alert and oriented. Well appearing and in no acute distress. Eyes: Conjunctivae are normal. EOMI. Head: Atraumatic. Neck: No stridor.  Respiratory: Normal respiratory effort.   Musculoskeletal: ATFL pattern tenderness of the left ankle.  Neurologic:  Normal speech  and language. No gross focal neurologic deficits are appreciated. Speech is normal. No gait instability. Skin:  Skin is warm and dry. Abrasions noted to bilateral knees.  Psychiatric: Mood and affect are normal. Speech and behavior are normal.  ____________________________________________   LABS (all labs ordered are listed, but only abnormal results are displayed)  Labs Reviewed - No data to display ____________________________________________  RADIOLOGY  Left ankle without bony abnormality per radiology.  ____________________________________________   PROCEDURES  Procedure(s) performed:   Velcro ankle stirrup splint applied to left ankle by ER Tech. Neurovascularly intact post application. ____________________________________________   INITIAL IMPRESSION / ASSESSMENT AND PLAN / ED COURSE  Pertinent labs & imaging results that were available during my care of the patient were  reviewed by me and considered in my medical decision making (see chart for details).  Patient to follow up with the orthopedic doctor for symptoms that are not improving over the week. He was advised to take tramadol and naproxen as prescribed if needed for pain. ____________________________________________   FINAL CLINICAL IMPRESSION(S) / ED DIAGNOSES  Final diagnoses:  Ankle sprain, left, initial encounter       Chinita PesterCari B Nomi Rudnicki, FNP 11/13/15 1355  Sharman CheekPhillip Stafford, MD 11/16/15 1435

## 2015-11-13 NOTE — ED Notes (Signed)
Pt reports that he stepped off a curb and turned his ankle yesterday. He had prior sprain to this ankle about 3 months ago. Ankle appears slightly swollen, and pt states that he is unable to move it much. NAD noted.

## 2015-11-28 ENCOUNTER — Encounter: Payer: Self-pay | Admitting: Emergency Medicine

## 2015-11-28 ENCOUNTER — Emergency Department
Admission: EM | Admit: 2015-11-28 | Discharge: 2015-11-29 | Disposition: A | Discharge status: Home / Self Care | Payer: Self-pay | Attending: Emergency Medicine | Admitting: Emergency Medicine

## 2015-11-28 DIAGNOSIS — S0003XA Contusion of scalp, initial encounter: Secondary | ICD-10-CM | POA: Insufficient documentation

## 2015-11-28 DIAGNOSIS — Z79899 Other long term (current) drug therapy: Secondary | ICD-10-CM | POA: Insufficient documentation

## 2015-11-28 DIAGNOSIS — F1910 Other psychoactive substance abuse, uncomplicated: Secondary | ICD-10-CM | POA: Insufficient documentation

## 2015-11-28 DIAGNOSIS — Y929 Unspecified place or not applicable: Secondary | ICD-10-CM | POA: Insufficient documentation

## 2015-11-28 DIAGNOSIS — Y999 Unspecified external cause status: Secondary | ICD-10-CM | POA: Insufficient documentation

## 2015-11-28 DIAGNOSIS — E876 Hypokalemia: Secondary | ICD-10-CM | POA: Insufficient documentation

## 2015-11-28 DIAGNOSIS — W228XXA Striking against or struck by other objects, initial encounter: Secondary | ICD-10-CM | POA: Insufficient documentation

## 2015-11-28 DIAGNOSIS — I10 Essential (primary) hypertension: Secondary | ICD-10-CM | POA: Insufficient documentation

## 2015-11-28 DIAGNOSIS — R569 Unspecified convulsions: Secondary | ICD-10-CM

## 2015-11-28 DIAGNOSIS — Z792 Long term (current) use of antibiotics: Secondary | ICD-10-CM | POA: Insufficient documentation

## 2015-11-28 DIAGNOSIS — Z791 Long term (current) use of non-steroidal anti-inflammatories (NSAID): Secondary | ICD-10-CM | POA: Insufficient documentation

## 2015-11-28 DIAGNOSIS — F17200 Nicotine dependence, unspecified, uncomplicated: Secondary | ICD-10-CM | POA: Insufficient documentation

## 2015-11-28 DIAGNOSIS — Y939 Activity, unspecified: Secondary | ICD-10-CM | POA: Insufficient documentation

## 2015-11-28 DIAGNOSIS — S0001XA Abrasion of scalp, initial encounter: Secondary | ICD-10-CM

## 2015-11-28 LAB — BASIC METABOLIC PANEL
ANION GAP: 15 (ref 5–15)
BUN: 6 mg/dL (ref 6–20)
CALCIUM: 9.1 mg/dL (ref 8.9–10.3)
CO2: 21 mmol/L — AB (ref 22–32)
Chloride: 102 mmol/L (ref 101–111)
Creatinine, Ser: 0.91 mg/dL (ref 0.61–1.24)
Glucose, Bld: 134 mg/dL — ABNORMAL HIGH (ref 65–99)
Potassium: 3.1 mmol/L — ABNORMAL LOW (ref 3.5–5.1)
SODIUM: 138 mmol/L (ref 135–145)

## 2015-11-28 LAB — CBC
HCT: 53.3 % — ABNORMAL HIGH (ref 40.0–52.0)
HEMOGLOBIN: 18.2 g/dL — AB (ref 13.0–18.0)
MCH: 29.1 pg (ref 26.0–34.0)
MCHC: 34.1 g/dL (ref 32.0–36.0)
MCV: 85.3 fL (ref 80.0–100.0)
PLATELETS: 262 10*3/uL (ref 150–440)
RBC: 6.25 MIL/uL — AB (ref 4.40–5.90)
RDW: 14.1 % (ref 11.5–14.5)
WBC: 9.3 10*3/uL (ref 3.8–10.6)

## 2015-11-28 LAB — URINE DRUG SCREEN, QUALITATIVE (ARMC ONLY)
AMPHETAMINES, UR SCREEN: NOT DETECTED
Barbiturates, Ur Screen: NOT DETECTED
Benzodiazepine, Ur Scrn: POSITIVE — AB
COCAINE METABOLITE, UR ~~LOC~~: NOT DETECTED
Cannabinoid 50 Ng, Ur ~~LOC~~: POSITIVE — AB
MDMA (ECSTASY) UR SCREEN: NOT DETECTED
METHADONE SCREEN, URINE: NOT DETECTED
Opiate, Ur Screen: NOT DETECTED
Phencyclidine (PCP) Ur S: NOT DETECTED
TRICYCLIC, UR SCREEN: NOT DETECTED

## 2015-11-28 MED ORDER — POTASSIUM CHLORIDE CRYS ER 20 MEQ PO TBCR
40.0000 meq | EXTENDED_RELEASE_TABLET | Freq: Once | ORAL | Status: AC
  Administered 2015-11-28: 40 meq via ORAL
  Filled 2015-11-28: qty 2

## 2015-11-28 NOTE — ED Notes (Addendum)
Pt to rm 12 via EMS from work.  EMS report pt had seizure, pt reports hx grand mal, EMS report pt was post-ictal upon their arrival but now A&O x 4.  EMS report pt hit head, abrasion noted to pt's right scalp.  Pt reports hx of seizures x 2 years, not currently on meds or w/ a neurologist.  Pt reports mild ha at this time, 5/10.  PT NAD at this time, resp equal and unlabored, skin warm and dry.    Seizure pads applied to bed.

## 2015-11-28 NOTE — Discharge Instructions (Signed)
You were evaluated after a witnessed seizure. You do need to follow up with a neurologist, either the previous neurologist at [facility] neurology, or Dr. Sherryll Burger here at [facility].  No driving until cleared and evaluated by a neurologist. Avoid marijuana and any other drugs as these can lower the seizure threshold making it more likely to have a seizure.   Contusion A contusion is a deep bruise. Contusions happen when an injury causes bleeding under the skin. Symptoms of bruising include pain, swelling, and discolored skin. The skin may turn blue, purple, or yellow. HOME CARE   Rest the injured area.  If told, put ice on the injured area.  Put ice in a plastic bag.  Place a towel between your skin and the bag.  Leave the ice on for 20 minutes, 2-3 times per day.  If told, put light pressure (compression) on the injured area using an elastic bandage. Make sure the bandage is not too tight. Remove it and put it back on as told by your doctor.  If possible, raise (elevate) the injured area above the level of your heart while you are sitting or lying down.  Take over-the-counter and prescription medicines only as told by your doctor. GET HELP IF:  Your symptoms do not get better after several days of treatment.  Your symptoms get worse.  You have trouble moving the injured area. GET HELP RIGHT AWAY IF:   You have very bad pain.  You have a loss of feeling (numbness) in a hand or foot.  Your hand or foot turns pale or cold.   This information is not intended to replace advice given to you by your health care provider. Make sure you discuss any questions you have with your health care provider.   Document Released: 10/20/2007 Document Revised: 01/22/2015 Document Reviewed: 09/18/2014 Elsevier Interactive Patient Education 2016 Elsevier Inc.  Abrasion An abrasion is a cut or scrape on the surface of your skin. An abrasion does not go through all of the layers of your skin.  It is important to take good care of your abrasion to prevent infection. HOME CARE Medicines  Take or apply medicines only as told by your doctor.  If you were prescribed an antibiotic ointment, finish all of it even if you start to feel better. Wound Care  Clean the wound with mild soap and water 2-3 times per day or as told by your doctor. Pat your wound dry with a clean towel. Do not rub it.  There are many ways to close and cover a wound. Follow instructions from your doctor about:  How to take care of your wound.  When and how you should change your bandage (dressing).  When and how you should take off your dressing.  Check your wound every day for signs of infection. Watch for:  Redness, swelling, or pain.  Fluid, blood, or pus. General Instructions  Keep the dressing dry as told by your doctor. Do not take baths, swim, use a hot tub, or do anything that would put your wound underwater until your doctor says it is okay.  If there is swelling, raise (elevate) the injured area above the level of your heart while you are sitting or lying down.  Keep all follow-up visits as told by your doctor. This is important. GET HELP IF:  You were given a tetanus shot and you have any of these where the needle went in:  Swelling.  Very bad pain.  Redness.  Bleeding.  Medicine does not help your pain.  You have any of these at the site of the wound:  More redness.  More swelling.  More pain. GET HELP RIGHT AWAY IF:  You have a red streak going away from your wound.  You have a fever.  You have fluid, blood, or pus coming from your wound.  There is a bad smell coming from your wound.   This information is not intended to replace advice given to you by your health care provider. Make sure you discuss any questions you have with your health care provider.   Document Released: 10/20/2007 Document Revised: 09/17/2014 Document Reviewed: 05/01/2014 Elsevier Interactive  Patient Education 2016 [employer].  Seizure, Adult A seizure is abnormal electrical activity in the brain. Seizures usually last from 30 seconds to 2 minutes. There are various types of seizures. Before a seizure, you may have a warning sensation (aura) that a seizure is about to occur. An aura may include the following symptoms:   Fear or anxiety.  Nausea.  Feeling like the room is spinning (vertigo).  Vision changes, such as seeing flashing lights or spots. Common symptoms during a seizure include:  A change in attention or behavior (altered mental status).  Convulsions with rhythmic jerking movements.  Drooling.  Rapid eye movements.  Grunting.  Loss of bladder and bowel control.  Bitter taste in the mouth.  Tongue biting. After a seizure, you may feel confused and sleepy. You may also have an injury resulting from convulsions during the seizure. HOME CARE INSTRUCTIONS   If you are given medicines, take them exactly as prescribed by your health care provider.  Keep all follow-up appointments as directed by your health care provider.  Do not swim or drive or engage in risky activity during which a seizure could cause further injury to you or others until your health care provider says it is OK.  Get adequate rest.  Teach friends and family what to do if you have a seizure. They should:  Lay you on the ground to prevent a fall.  Put a cushion under your head.  Loosen any tight clothing around your neck.  Turn you on your side. If vomiting occurs, this helps keep your airway clear.  Stay with you until you recover.  Know whether or not you need emergency care. SEEK IMMEDIATE MEDICAL CARE IF:  The seizure lasts longer than 5 minutes.  The seizure is severe or you do not wake up immediately after the seizure.  You have an altered mental status after the seizure.  You are having more frequent or worsening seizures. Someone should drive you to the  emergency department or call local emergency services (911 in [place] MAKE SURE YOU:  Understand these instructions.  Will watch your condition.  Will get help right away if you are not doing well or get worse.   This information is not intended to replace advice given to you by your health care provider. Make sure you discuss any questions you have with your health care provider.   Document Released: 04/30/2000 Document Revised: 05/24/2014 Document Reviewed: 12/13/2012 Elsevier Interactive Patient Education 2016 [employer].  Substance Abuse Testing WHY AM I HAVING THIS TEST? Substance abuse testing is done to identify the presence of drugs in the body. It may also be done to:  Help guide treatment if you have seizures.  Measure the levels of certain medicines in your body, including anabolic steroids, stimulants, diuretics, and lithium. Substance abuse  testing is most often used by employers or Patent examinerlaw enforcement agencies to identify whether a person has used illegal drugs, such as cocaine, amphetamines, and marijuana. WHAT KIND OF SAMPLE IS TAKEN? Your health care provider may collect at least one of the following to perform the test:  Urine. A urine sample is collected in a sterile container given to you by the lab. If you are asked to provide a urine sample, do not alter it in any way.  Hair. A sample of hair requires cutting 50 strands of hair from your scalp.  Blood. A blood sample is usually collected by inserting a needle into a vein. HOW DO I PREPARE FOR THE TEST? You may be asked to provide a list of your current prescription medicines. If the test is required for employment or legal reasons, you will be asked to give consent for the test. There is no other preparation required. HOW ARE THE TEST RESULTS REPORTED? Your test results will be reported as either positive or negative. It may be your responsibility to obtain your test results. Ask the lab or department performing  the test when and how you will get your results. WHAT DO THE RESULTS MEAN? A negative result means no drugs were found. A positive result may mean you have recently used drugs. If your result is positive, more testing will be done to confirm the presence of drugs.   This information is not intended to replace advice given to you by your health care provider. Make sure you discuss any questions you have with your health care provider.   Document Released: 05/03/2005 Document Revised: 05/24/2014 Document Reviewed: 09/28/2013 Elsevier Interactive Patient Education [employer] Elsevier Inc.

## 2015-11-28 NOTE — ED Provider Notes (Signed)
[facility] Emergency Department Provider Note   ____________________________________________  Time seen: I have reviewed the triage vital signs and the triage nursing note.  HISTORY  Chief Complaint Seizures   Historian Patient and father  HPI Garrett Adams is a 25 y.o. male was brought in for evaluation after a witnessed generalized tonoclonic seizure. He reportedly did scrape his head and has a hematoma there, he is not reporting a headache. He is alert and oriented now. He states that he's had 2 seizures in the past, this is his third. 1 and 2012 1 2014 by record review.  He states that he was never started on medication after a full workup. He is not sure why. He denies drug or alcohol abuse. He denies a history of withdrawal seizures.    Past Medical History  Diagnosis Date  . Hypertension   . Seizures (HCC)     There are no active problems to display for this patient.   History reviewed. No pertinent past surgical history.  Current Outpatient Rx  Name  Route  Sig  Dispense  Refill  . azithromycin (ZITHROMAX Z-PAK) 250 MG tablet      Take 2 tablets (500 mg) on  Day 1,  followed by 1 tablet (250 mg) once daily on Days 2 through 5.   6 each   0   . guaiFENesin-codeine 100-10 MG/5ML syrup   Oral   Take 10 mLs by mouth every 4 (four) hours as needed for cough.   180 mL   0   . naproxen (NAPROSYN) 500 MG tablet   Oral   Take 1 tablet (500 mg total) by mouth 2 (two) times daily with a meal.   30 tablet   0   . pseudoephedrine (SUDAFED) 60 MG tablet   Oral   Take 1 tablet (60 mg total) by mouth every 4 (four) hours as needed for congestion.   24 tablet   0   . traMADol (ULTRAM) 50 MG tablet   Oral   Take 1 tablet (50 mg total) by mouth every 6 (six) hours as needed.   12 tablet   0     Allergies Review of patient's allergies indicates no known allergies.  History reviewed. No pertinent family history.  Social  History Social History  Substance Use Topics  . Smoking status: Current Every Day Smoker  . Smokeless tobacco: None  . Alcohol Use: Yes     Comment: occasional    Review of Systems  Constitutional: Negative for fever. Eyes: Negative for visual changes. ENT: Negative for sore throat. Cardiovascular: Negative for chest pain. Respiratory: Negative for shortness of breath. Gastrointestinal: Negative for abdominal pain, vomiting and diarrhea. Genitourinary: Negative for dysuria. Musculoskeletal: Negative for back pain. Skin: Negative for rash. Neurological: Negative for headache. 10 point Review of Systems otherwise negative ____________________________________________   PHYSICAL EXAM:  VITAL SIGNS: ED Triage Vitals  Enc Vitals Group     BP 11/28/15 2110 142/100 mmHg     Pulse Rate 11/28/15 2110 118     Resp 11/28/15 2110 16     Temp 11/28/15 2110 97.7 F (36.5 C)     Temp Source 11/28/15 2110 Oral     SpO2 11/28/15 2110 94 %     Weight 11/28/15 2110 215 lb (97.523 kg)     Height 11/28/15 2110  (1.88 m)     Head Cir --      Peak Flow --  Pain Score 11/28/15 2111 5     Pain Loc --      Pain Edu? --      Excl. in GC? --      Constitutional: Alert and oriented. Well appearing and in no distress. HEENT   Head: Scalp hematoma right parietal area with an oozing abrasion. No laceration.      Eyes: Conjunctivae are normal. PERRL. Normal extraocular movements.      Ears:         Nose: No congestion/rhinnorhea.   Mouth/Throat: Mucous membranes are moist.   Neck: No stridor. Cardiovascular/Chest: Normal rate, regular rhythm.  No murmurs, rubs, or gallops. Respiratory: Normal respiratory effort without tachypnea nor retractions. Breath sounds are clear and equal bilaterally. No wheezes/rales/rhonchi. Gastrointestinal: Soft. No distention, no guarding, no rebound. Nontender.    Genitourinary/rectal:Deferred Musculoskeletal: Nontender with normal range of  motion in all extremities. No joint effusions.  No lower extremity tenderness.  No edema. Neurologic:  Normal speech and language. No gross or focal neurologic deficits are appreciated. Skin:  Skin is warm, dry and intact. No rash noted. Psychiatric: Mood and affect are normal. Speech and behavior are normal. Patient exhibits appropriate insight and judgment.  ____________________________________________   EKG I, Governor Rooksebecca Loron Weimer, MD, the attending physician have personally viewed and interpreted all ECGs.  107 bpm sinus tachycardia. Normal axis. Narrow QRS. Normal ST and T-wave ____________________________________________  LABS (pertinent positives/negatives)  Labs Reviewed  BASIC METABOLIC PANEL - Abnormal; Notable for the following:    Potassium 3.1 (*)    CO2 21 (*)    Glucose, Bld 134 (*)    All other components within normal limits  CBC - Abnormal; Notable for the following:    RBC 6.25 (*)    Hemoglobin 18.2 (*)    HCT 53.3 (*)    All other components within normal limits  URINE DRUG SCREEN, QUALITATIVE ([facility] ONLY) - Abnormal; Notable for the following:    Cannabinoid 50 Ng, Ur  POSITIVE (*)    Benzodiazepine, Ur Scrn POSITIVE (*)    All other components within normal limits    ____________________________________________  RADIOLOGY All Xrays were viewed by me. Imaging interpreted by Radiologist.  None __________________________________________  PROCEDURES  Procedure(s) performed: None  Critical Care performed: None  ____________________________________________   ED COURSE / ASSESSMENT AND PLAN  Pertinent labs & imaging results that were available during my care of the patient were reviewed by me and considered in my medical decision making (see chart for details).   This patient came in after a seizure and is back to baseline.   From a trauma perspective he does have a hematoma to his scalp but he is alert and oriented now and at baseline mental status  with his father at the bedside. I discussed with him I don't have a high suspicion for intracranial traumatic injury, and we chose not to pursue head CT at this point in time. We discussed return precautions with respect to that.  In terms of the seizure, patient history is a bit suspect to me that he was never started on medication after second seizure. When I reviewed his prior evaluation he has a history of polysubstance abuse and after a normal MRI and EEG he was not recommended based on polysubstance abuse and seizures.  The patient denied drug abuse to me, however his urine drug screen is positive for cannot avoided benzodiazepines. I do suspect a seizure today is likely related to his polysubstance abuse.  I have  given him seizure precautions including no driving until he is reevaluated.   He is referred to follow up back with [facility] neurology or Dr. Sherryll Burger here at [facility] clinic neurology.    CONSULTATIONS:   None   Patient / Family / Caregiver informed of clinical course, medical decision-making process, and agree with plan.   I discussed return precautions, follow-up instructions, and discharged instructions with patient and/or family.   ___________________________________________   FINAL CLINICAL IMPRESSION(S) / ED DIAGNOSES   Final diagnoses:  Seizure (HCC)  Hypokalemia  Scalp hematoma, initial encounter  Scalp abrasion, initial encounter  Polysubstance abuse              Note: This dictation was prepared with Dragon dictation. Any transcriptional errors that result from this process are unintentional   Governor Rooks, MD 11/28/15 2355

## 2016-02-07 ENCOUNTER — Emergency Department: Payer: Self-pay

## 2016-02-07 ENCOUNTER — Emergency Department
Admission: EM | Admit: 2016-02-07 | Discharge: 2016-02-07 | Disposition: A | Discharge status: Home / Self Care | Payer: Self-pay | Attending: Emergency Medicine | Admitting: Emergency Medicine

## 2016-02-07 ENCOUNTER — Encounter: Payer: Self-pay | Admitting: Emergency Medicine

## 2016-02-07 DIAGNOSIS — F17210 Nicotine dependence, cigarettes, uncomplicated: Secondary | ICD-10-CM | POA: Insufficient documentation

## 2016-02-07 DIAGNOSIS — Z79899 Other long term (current) drug therapy: Secondary | ICD-10-CM | POA: Insufficient documentation

## 2016-02-07 DIAGNOSIS — J209 Acute bronchitis, unspecified: Secondary | ICD-10-CM | POA: Insufficient documentation

## 2016-02-07 DIAGNOSIS — I10 Essential (primary) hypertension: Secondary | ICD-10-CM | POA: Insufficient documentation

## 2016-02-07 MED ORDER — AZITHROMYCIN 250 MG PO TABS: ORAL_TABLET | ORAL | 0 refills | Status: DC

## 2016-02-07 MED ORDER — PROMETHAZINE-DM 6.25-15 MG/5ML PO SYRP
5.0000 mL | ORAL_SOLUTION | Freq: Four times a day (QID) | ORAL | 0 refills | Status: DC | PRN
Start: 2016-02-07 — End: 2017-01-24

## 2016-02-07 MED ORDER — ALBUTEROL SULFATE (2.5 MG/3ML) 0.083% IN NEBU
2.5000 mg | INHALATION_SOLUTION | Freq: Once | RESPIRATORY_TRACT | Status: AC
  Administered 2016-02-07: 2.5 mg via RESPIRATORY_TRACT
  Filled 2016-02-07: qty 3

## 2016-02-07 MED ORDER — PREDNISONE 20 MG PO TABS: ORAL_TABLET | ORAL | 0 refills | Status: DC

## 2016-02-07 MED ORDER — ALBUTEROL SULFATE HFA 108 (90 BASE) MCG/ACT IN AERS: 1.0000 | INHALATION_SPRAY | Freq: Four times a day (QID) | RESPIRATORY_TRACT | 0 refills | Status: DC | PRN

## 2016-02-07 NOTE — ED Triage Notes (Signed)
Cough x 2 weeks, denies fevers.

## 2016-02-07 NOTE — Discharge Instructions (Signed)
Please return to the emergency department if you worsen or have onset of new symptoms.

## 2016-02-07 NOTE — ED Provider Notes (Signed)
[facility] Emergency Department Provider Note  ____________________________________________  Time seen: Approximately 2:47 PM  I have reviewed the triage vital signs and the nursing notes.   HISTORY  Chief Complaint Cough    HPI Garrett Adams is a 25 y.o. male , NAD, presents to the emergency department 2 week history of cough and chest congestion. States he has had worsening cough and chest congestion over the last couple weeks accompanied by nasal congestion and sinus pressure. Notes that he has seen blood in his mucus as well as had posttussive emesis with blood a few days ago that has resolved. Feels short of breath especially when he coughs. Has chest pain with cough only. Has noted wheezing that worsens at night. States that his children have been sick recently with upper respiratory infections. Denies any fevers or chills but has had sweats. Denies any back pain. Has not had any swelling about his lower extremities and denies any history of DVT or PE. Has not had any injury or trauma to the chest or abdomen. Has not been on any trips requiring long travel and does not have a sedentary lifestyle. Has been taking over-the-counter medications without any alleviation of symptoms.   Past Medical History:  Diagnosis Date  . Hypertension   . Seizures (HCC)     There are no active problems to display for this patient.   History reviewed. No pertinent surgical history.  Prior to Admission medications   Medication Sig Start Date End Date Taking? Authorizing Provider  albuterol (PROVENTIL HFA;VENTOLIN HFA) 108 (90 Base) MCG/ACT inhaler Inhale 1-2 puffs into the lungs every 6 (six) hours as needed for wheezing or shortness of breath. 02/07/16   Jami L Hagler, PA-C  azithromycin (ZITHROMAX Z-PAK) 250 MG tablet Take 2 tablets (500 mg) on  Day 1,  followed by 1 tablet (250 mg) once daily on Days 2 through 5. 02/07/16   Jami L Hagler, PA-C  guaiFENesin-codeine  100-10 MG/5ML syrup Take 10 mLs by mouth every 4 (four) hours as needed for cough. 09/03/15   Charmayne Sheer Beers, PA-C  naproxen (NAPROSYN) 500 MG tablet Take 1 tablet (500 mg total) by mouth 2 (two) times daily with a meal. 11/13/15   Cari B Triplett, FNP  predniSONE (DELTASONE) 20 MG tablet Take 2 tablets by mouth, once daily, for 5 days 02/07/16   Jami L Hagler, PA-C  promethazine-dextromethorphan (PROMETHAZINE-DM) 6.25-15 MG/5ML syrup Take 5 mLs by mouth 4 (four) times daily as needed for cough. 02/07/16   Jami L Hagler, PA-C  pseudoephedrine (SUDAFED) 60 MG tablet Take 1 tablet (60 mg total) by mouth every 4 (four) hours as needed for congestion. 09/03/15   Charmayne Sheer Beers, PA-C  traMADol (ULTRAM) 50 MG tablet Take 1 tablet (50 mg total) by mouth every 6 (six) hours as needed. 11/13/15   Chinita Pester, FNP    Allergies Review of patient's allergies indicates no known allergies.  No family history on file.  Social History Social History  Substance Use Topics  . Smoking status: Current Every Day Smoker    Packs/day: 1.00    Types: Cigarettes  . Smokeless tobacco: Not on file  . Alcohol use Yes     Comment: occasional     Review of Systems  Constitutional: Positive sweats. No fever, chills, decreased appetite. ENT: Positive nasal congestion, runny nose, sinus pressure. Negative sore throat, ear pain, ear drainage Cardiovascular: No chest pain, Palpitations, extremity edema. Respiratory: Positive productive cough, chest congestion,  hemoptysis, shortness of breath and wheezing.  Gastrointestinal: Positive hematemesis 1 that has resolved. No abdominal pain.  No nausea, vomiting.  No diarrhea, constipation. Genitourinary: Negative for dysuria. No hematuria. No urinary hesitancy, urgency or increased frequency. Musculoskeletal: Positive general myalgias. Negative for back pain.  Skin: Negative for rash, redness, abnormal warmth, swelling, bruising, open wounds or skin sores. Neurological:  Negative for headaches, focal weakness or numbness. No tingling. 10-point ROS otherwise negative.  ____________________________________________   PHYSICAL EXAM:  VITAL SIGNS: ED Triage Vitals  Enc Vitals Group     BP 02/07/16 1252 (!) 136/100     Pulse Rate 02/07/16 1252 88     Resp 02/07/16 1252 18     Temp 02/07/16 1252 98 F (36.7 C)     Temp Source 02/07/16 1252 Oral     SpO2 02/07/16 1252 98 %     Weight 02/07/16 1253 215 lb (97.5 kg)     Height 02/07/16 1253 6' (1.829 m)     Head Circumference --      Peak Flow --      Pain Score 02/07/16 1253 7     Pain Loc --      Pain Edu? --      Excl. in GC? --      Constitutional: Alert and oriented. Ill appearing but in no acute distress. Eyes: Conjunctivae are normal without icterus or injection. PERRL. EOMI without pain.  Head: Atraumatic. ENT:      Ears: TMs visualized bilaterally with mild bulging and trace injection but no effusion or perforation.      Nose: Moderate congestion bilaterally with trace clear rhinorrhea. Bilateral turbinates with moderate injection      Mouth/Throat: Mucous membranes are moist. Pharynx with mild injection but no swelling or exudate. Clear postnasal drip. Airways patent. Uvula is midline. Neck: Supple with full range of motion. No stridor, carotid bruits. Trachea is midline. Hematological/Lymphatic/Immunilogical: No cervical lymphadenopathy. Cardiovascular: Normal rate, regular rhythm. Normal S1 and S2.  Good peripheral circulation with 2+ pulses noted in bilateral lower extremities. Respiratory: Normal respiratory effort without tachypnea or retractions. Lungs with diffuse mild wheeze and moderate rhonchi throughout bilateral lung fields. Breath sounds are heard in all lung fields bilaterally. Gastrointestinal: Soft and nontender without distention or guarding in all quadrants. No rebound or rigidity. Sounds are grossly normal active in all quadrants. Musculoskeletal: No lower extremity  tenderness nor edema.  No joint effusions. Neurologic:  Normal speech and language. No gross focal neurologic deficits are appreciated.  Skin:  Skin is warm, dry and intact. No rash noted. Psychiatric: Mood and affect are normal. Speech and behavior are normal. Patient exhibits appropriate insight and judgement.   ____________________________________________   LABS  None ____________________________________________  EKG  None ____________________________________________  RADIOLOGY I have personally viewed and evaluated these images (plain radiographs) as part of my medical decision making, as well as reviewing the written report by the radiologist.  Dg Chest 2 View  Result Date: 02/07/2016 CLINICAL DATA:  Worsening productive cough for the past 2 weeks. Smoker. EXAM: CHEST  2 VIEW COMPARISON:  09/03/2015. FINDINGS: Normal sized heart. Clear lungs. Minimal diffuse peribronchial thickening. Unremarkable bones. IMPRESSION: Minimal bronchitic changes. Electronically Signed   By: Beckie Salts M.D.   On: 02/07/2016 13:27    ____________________________________________    PROCEDURES  Procedure(s) performed: None   Procedures   Medications  albuterol (PROVENTIL) (2.5 MG/3ML) 0.083% nebulizer solution 2.5 mg (2.5 mg Nebulization Given 02/07/16 1540)   ----------------------------------------- 3:55 PM  on 02/07/2016 -----------------------------------------  Patient notes significant improvement in breathing after being given albuterol nebulized solution. Respiratory exam shows significant decrease in rhonchi as well as wheezing.  ____________________________________________   INITIAL IMPRESSION / ASSESSMENT AND PLAN / ED COURSE  Pertinent labs & imaging results that were available during my care of the patient were reviewed by me and considered in my medical decision making (see chart for details).  Clinical Course  Comment By Time  I spoke with Dr. Shaune PollackLord in regards to the  patient's history, physical and imaging results. Due to the patient's history of hemoptysis and previous episode of bloody emesis with associated cough and chest congestion, I feel there is a very low likelihood that he has a pulmonary embolism. Patient has normal heart rate at 88 and oxygen saturation are 98% on room air. He is not tachypneic with a respiratory rate of 18. Physical exam was revealed diffuse wheeze with moderate rhonchi and a chest x-ray revealing bronchitic changes. Dr. Shaune PollackLord is in agreement with this assessment and is comfortable with me treating the patient for acute bronchitis. I discussed all of this information with the patient and he is in agreement with this plan. He states that he also believes that his symptoms have been related to upper respiratory infection and he would prefer treatment of infection at this time without unnecessary workup. He verbalizes understanding that if he has any worsening symptoms, onset of new symptoms or any further hemoptysis or vomiting of blood he will return to the emergency department for further evaluation and treatment. Hope PigeonJami L Hagler, PA-C 09/23 1522    Patient's diagnosis is consistent with acute bronchitis. Patient will be discharged home with prescriptions for albuterol inhaler, Z-Pak, prednisone and promethazine DM cough syrup to take as directed. Patient is to [facility] in 48 hours for recheck. Patient is given ED precautions to return to the ED for any worsening or new symptoms.    ____________________________________________  FINAL CLINICAL IMPRESSION(S) / ED DIAGNOSES  Final diagnoses:  Acute bronchitis, unspecified organism      NEW MEDICATIONS STARTED DURING THIS VISIT:  New Prescriptions   ALBUTEROL (PROVENTIL HFA;VENTOLIN HFA) 108 (90 BASE) MCG/ACT INHALER    Inhale 1-2 puffs into the lungs every 6 (six) hours as needed for wheezing or shortness of breath.   AZITHROMYCIN (ZITHROMAX Z-PAK) 250 MG  TABLET    Take 2 tablets (500 mg) on  Day 1,  followed by 1 tablet (250 mg) once daily on Days 2 through 5.   PREDNISONE (DELTASONE) 20 MG TABLET    Take 2 tablets by mouth, once daily, for 5 days   PROMETHAZINE-DEXTROMETHORPHAN (PROMETHAZINE-DM) 6.25-15 MG/5ML SYRUP    Take 5 mLs by mouth 4 (four) times daily as needed for cough.         Hope PigeonJami L Hagler, PA-C 02/07/16 1556    Governor Rooksebecca Lord, MD 02/07/16 1558

## 2016-12-21 ENCOUNTER — Encounter: Payer: Self-pay | Admitting: *Deleted

## 2016-12-21 ENCOUNTER — Emergency Department: Payer: Self-pay

## 2016-12-21 ENCOUNTER — Emergency Department
Admission: EM | Admit: 2016-12-21 | Discharge: 2016-12-21 | Disposition: A | Discharge status: Home / Self Care | Payer: Self-pay | Attending: Emergency Medicine | Admitting: Emergency Medicine

## 2016-12-21 DIAGNOSIS — Z79899 Other long term (current) drug therapy: Secondary | ICD-10-CM | POA: Insufficient documentation

## 2016-12-21 DIAGNOSIS — R0602 Shortness of breath: Secondary | ICD-10-CM | POA: Insufficient documentation

## 2016-12-21 DIAGNOSIS — F17210 Nicotine dependence, cigarettes, uncomplicated: Secondary | ICD-10-CM | POA: Insufficient documentation

## 2016-12-21 LAB — URINALYSIS, COMPLETE (UACMP) WITH MICROSCOPIC
BACTERIA UA: NONE SEEN
Bilirubin Urine: NEGATIVE
Glucose, UA: NEGATIVE mg/dL
Hgb urine dipstick: NEGATIVE
Ketones, ur: NEGATIVE mg/dL
Leukocytes, UA: NEGATIVE
Nitrite: NEGATIVE
PROTEIN: NEGATIVE mg/dL
Specific Gravity, Urine: 1.012 (ref 1.005–1.030)
Squamous Epithelial / LPF: NONE SEEN
pH: 8 (ref 5.0–8.0)

## 2016-12-21 LAB — BASIC METABOLIC PANEL
ANION GAP: 10 (ref 5–15)
BUN: 8 mg/dL (ref 6–20)
CALCIUM: 9.5 mg/dL (ref 8.9–10.3)
CO2: 27 mmol/L (ref 22–32)
Chloride: 101 mmol/L (ref 101–111)
Creatinine, Ser: 0.96 mg/dL (ref 0.61–1.24)
GFR calc Af Amer: >60 mL/min (ref >60)
GFR calc non Af Amer: >60 mL/min (ref >60)
GLUCOSE: 114 mg/dL — AB (ref 65–99)
Potassium: 4.2 mmol/L (ref 3.5–5.1)
Sodium: 138 mmol/L (ref 135–145)

## 2016-12-21 LAB — CBC
HEMATOCRIT: 54.5 % — AB (ref 40.0–52.0)
HEMOGLOBIN: 18.4 g/dL — AB (ref 13.0–18.0)
MCH: 30.7 pg (ref 26.0–34.0)
MCHC: 33.7 g/dL (ref 32.0–36.0)
MCV: 91.2 fL (ref 80.0–100.0)
Platelets: 231 10*3/uL (ref 150–440)
RBC: 5.98 MIL/uL — ABNORMAL HIGH (ref 4.40–5.90)
RDW: 15.1 % — ABNORMAL HIGH (ref 11.5–14.5)
WBC: 10.6 10*3/uL (ref 3.8–10.6)

## 2016-12-21 LAB — GLUCOSE, CAPILLARY: GLUCOSE-CAPILLARY: 104 mg/dL — AB (ref 65–99)

## 2016-12-21 MED ORDER — LANSOPRAZOLE 30 MG PO CPDR: 30.0000 mg | DELAYED_RELEASE_CAPSULE | Freq: Every day | ORAL | 1 refills | Status: DC

## 2016-12-21 MED ORDER — LORAZEPAM 1 MG PO TABS: 1.0000 mg | ORAL_TABLET | Freq: Two times a day (BID) | ORAL | 0 refills | Status: AC

## 2016-12-21 NOTE — ED Provider Notes (Signed)
[facility] Emergency Department Provider Note       Time seen: ----------------------------------------- 11:27 AM on 12/21/2016 -----------------------------------------     I have reviewed the triage vital signs and the nursing notes.   HISTORY   Chief Complaint Shortness of Breath    HPI Garrett Adams is a 26 y.o. male who presents to the ED for shortness of breath and feeling weak for the last month. He denies any pain, states had a dry cough and wakes up at night after he goes to sleep with periods of coughing and shortness of breath. He has had some dyspnea on exertion. He denies any recent illness or other complaints.   Past Medical History:  Diagnosis Date  . Hypertension   . Seizures (HCC)     There are no active problems to display for this patient.   History reviewed. No pertinent surgical history.  Allergies Patient has no known allergies.  Social History Social History  Substance Use Topics  . Smoking status: Current Every Day Smoker    Packs/day: 1.00    Types: Cigarettes  . Smokeless tobacco: Not on file  . Alcohol use Yes     Comment: occasional    Review of Systems Constitutional: Negative for fever. Eyes: Negative for vision changes ENT:  Negative for congestion, sore throat Cardiovascular: Negative for chest pain. Respiratory: Positive shortness of breath and cough Gastrointestinal: Negative for abdominal pain, vomiting and diarrhea. Genitourinary: Negative for dysuria. Musculoskeletal: Negative for back pain. Skin: Negative for rash. Neurological: Negative for headaches, focal weakness or numbness. Psychiatric: Positive for anxiety  All systems negative/normal/unremarkable except as stated in the HPI  ____________________________________________   PHYSICAL EXAM:  VITAL SIGNS: ED Triage Vitals  Enc Vitals Group     BP 12/21/16 1038 (!) 173/100     Pulse Rate 12/21/16 1038 94     Resp 12/21/16  1038 14     Temp 12/21/16 1038 98.1 F (36.7 C)     Temp Source 12/21/16 1038 Oral     SpO2 12/21/16 1038 98 %     Weight 12/21/16 1041 250 lb (113.4 kg)     Height 12/21/16 1041 6' (1.829 m)     Head Circumference --      Peak Flow --      Pain Score 12/21/16 1058 0     Pain Loc --      Pain Edu? --      Excl. in GC? --     Constitutional: Alert and oriented. Anxious, no distress Eyes: Conjunctivae are normal. Normal extraocular movements. ENT   Head: Normocephalic and atraumatic.   Nose: No congestion/rhinnorhea.   Mouth/Throat: Mucous membranes are moist.   Neck: No stridor. Cardiovascular: Normal rate, regular rhythm. No murmurs, rubs, or gallops. Respiratory: Normal respiratory effort without tachypnea nor retractions. Breath sounds are clear and equal bilaterally. No wheezes/rales/rhonchi. Gastrointestinal: Soft and nontender. Normal bowel sounds Musculoskeletal: Nontender with normal range of motion in extremities. No lower extremity tenderness nor edema. Neurologic:  Normal speech and language. No gross focal neurologic deficits are appreciated.  Skin:  Skin is warm, dry and intact. No rash noted. Psychiatric: Mood and affect are normal. Speech and behavior are normal.  ____________________________________________  EKG: Interpreted by me. Sinus rhythm with marked sinus arrhythmia, rate is 91 bpm, normal PR interval, normal QRS, normal QT.  ____________________________________________  ED COURSE:  Pertinent labs & imaging results that were available during my care of the patient were reviewed by  me and considered in my medical decision making (see chart for details). Patient presents for shortness of breath, we will assess with labs and imaging as indicated. Patient may have sleep apnea as well as reflux and anxiety.   Procedures ____________________________________________   LABS (pertinent positives/negatives)  Labs Reviewed  BASIC METABOLIC PANEL -  Abnormal; Notable for the following:       Result Value   Glucose, Bld 114 (*)    All other components within normal limits  CBC - Abnormal; Notable for the following:    RBC 5.98 (*)    Hemoglobin 18.4 (*)    HCT 54.5 (*)    RDW 15.1 (*)    All other components within normal limits  URINALYSIS, COMPLETE (UACMP) WITH MICROSCOPIC - Abnormal; Notable for the following:    Color, Urine YELLOW (*)    APPearance CLEAR (*)    All other components within normal limits  GLUCOSE, CAPILLARY - Abnormal; Notable for the following:    Glucose-Capillary 104 (*)    All other components within normal limits  CBG MONITORING, ED    RADIOLOGY  Chest x-ray  IMPRESSION: Normal exam. ____________________________________________  FINAL ASSESSMENT AND PLAN  Dyspnea  Plan: Patient's labs and imaging were dictated above. Patient had presented for Dyspnea is likely multifactorial. Patient will need to be tested for sleep apnea, in the meantime we will try him on antacids as well as some anxiety medicine to try as needed. He is stable for outpatient follow-up.   Emily FilbertWilliams, Volanda Mangine E, MD   Note: This note was generated in part or whole with voice recognition software. Voice recognition is usually quite accurate but there are transcription errors that can and very often do occur. I apologize for any typographical errors that were not detected and corrected.     Emily FilbertWilliams, Rhonin Trott E, MD 12/21/16 1248

## 2016-12-21 NOTE — ED Triage Notes (Signed)
Arrives c/o SOB for one week.  No SOB/ DOE seen.  Skin warm and dry.  NAD

## 2016-12-21 NOTE — ED Triage Notes (Signed)
States SOB and feeling weak for 1 month, denies any pain, states dry cough, awake and alert in no acute distress

## 2016-12-21 NOTE — ED Notes (Signed)
Pt alert and oriented X4, active, cooperative, pt in NAD. RR even and unlabored, color WNL.  Pt informed to return if any life threatening symptoms occur.   

## 2016-12-21 NOTE — ED Notes (Signed)
PT to xray

## 2017-01-24 ENCOUNTER — Emergency Department: Payer: Self-pay

## 2017-01-24 ENCOUNTER — Encounter: Payer: Self-pay | Admitting: *Deleted

## 2017-01-24 ENCOUNTER — Emergency Department
Admission: EM | Admit: 2017-01-24 | Discharge: 2017-01-24 | Disposition: A | Discharge status: Home / Self Care | Payer: Self-pay | Attending: Emergency Medicine | Admitting: Emergency Medicine

## 2017-01-24 DIAGNOSIS — M25511 Pain in right shoulder: Secondary | ICD-10-CM | POA: Insufficient documentation

## 2017-01-24 DIAGNOSIS — I10 Essential (primary) hypertension: Secondary | ICD-10-CM | POA: Insufficient documentation

## 2017-01-24 DIAGNOSIS — F17210 Nicotine dependence, cigarettes, uncomplicated: Secondary | ICD-10-CM | POA: Insufficient documentation

## 2017-01-24 DIAGNOSIS — Z79899 Other long term (current) drug therapy: Secondary | ICD-10-CM | POA: Insufficient documentation

## 2017-01-24 MED ORDER — ETODOLAC 400 MG PO TABS: 400.0000 mg | ORAL_TABLET | Freq: Two times a day (BID) | ORAL | 0 refills | Status: DC

## 2017-01-24 MED ORDER — KETOROLAC TROMETHAMINE 30 MG/ML IJ SOLN
30.0000 mg | Freq: Once | INTRAMUSCULAR | Status: AC
  Administered 2017-01-24: 30 mg via INTRAMUSCULAR
  Filled 2017-01-24: qty 1

## 2017-01-24 MED ORDER — METHOCARBAMOL 500 MG PO TABS: 500.0000 mg | ORAL_TABLET | Freq: Four times a day (QID) | ORAL | 0 refills | Status: DC

## 2017-01-24 NOTE — Discharge Instructions (Signed)
Ice or heat to shoulder as needed for pain. Begin taking etodolac 400 mg twice a day with food. Robaxin 500 mg one 4 times a day as needed for muscle spasms. Do not take this medication while driving or operating machinery. Follow-up with Dr. Ernest PineHooten if any continued problems with her shoulder.

## 2017-01-24 NOTE — ED Provider Notes (Signed)
[facility] Emergency Department Provider Note   ____________________________________________   First MD Initiated Contact with Patient 01/24/17 1255     (approximate)  I have reviewed the triage vital signs and the nursing notes.   HISTORY  Chief Complaint Shoulder Pain   HPI Garrett Adams is a 26 y.o. male is here complaining of right shoulder pain for approximately one week. Patient states that over the weekend it became more severe to where it has interfered with his sleep. Patient has been taking Tylenol without any relief. He believes that his work has aggravated his previous injury that occurred 10 years ago. He rates his pain as an 8 out of 10.   Past Medical History:  Diagnosis Date  . Hypertension   . Seizures (HCC)     There are no active problems to display for this patient.   History reviewed. No pertinent surgical history.  Prior to Admission medications   Medication Sig Start Date End Date Taking? Authorizing Provider  etodolac (LODINE) 400 MG tablet Take 1 tablet (400 mg total) by mouth 2 (two) times daily. 01/24/17   Tommi Rumps, PA-C  lansoprazole (PREVACID) 30 MG capsule Take 1 capsule (30 mg total) by mouth daily. 12/21/16 12/21/17  Emily Filbert, MD  LORazepam (ATIVAN) 1 MG tablet Take 1 tablet (1 mg total) by mouth 2 (two) times daily. 12/21/16 12/21/17  Emily Filbert, MD  methocarbamol (ROBAXIN) 500 MG tablet Take 1 tablet (500 mg total) by mouth 4 (four) times daily. 01/24/17   Tommi Rumps, PA-C    Allergies Patient has no known allergies.  History reviewed. No pertinent family history.  Social History Social History  Substance Use Topics  . Smoking status: Current Every Day Smoker    Packs/day: 1.00    Types: Cigarettes  . Smokeless tobacco: Not on file  . Alcohol use Yes     Comment: occasional    Review of Systems Constitutional: No fever/chills Cardiovascular: Denies chest  pain. Respiratory: Denies shortness of breath. Musculoskeletal: Positive for right shoulder pain. Skin: Negative for rash. Neurological: Negative for headaches, focal weakness or numbness. ___________________________________________   PHYSICAL EXAM:  VITAL SIGNS: ED Triage Vitals  Enc Vitals Group     BP 01/24/17 1206 (!) 150/91     Pulse Rate 01/24/17 1206 78     Resp 01/24/17 1206 16     Temp 01/24/17 1206 98 F (36.7 C)     Temp Source 01/24/17 1206 Oral     SpO2 01/24/17 1206 97 %     Weight 01/24/17 1207 250 lb (113.4 kg)     Height 01/24/17 1207 6' (1.829 m)     Head Circumference --      Peak Flow --      Pain Score 01/24/17 1212 8     Pain Loc --      Pain Edu? --      Excl. in GC? --    Constitutional: Alert and oriented. Well appearing and in no acute distress. Eyes: Conjunctivae are normal.  Head: Atraumatic. Neck: No stridor.  Nontender cervical spine to palpation posteriorly. Range of motion is without restriction. Cardiovascular: Normal rate, regular rhythm. Grossly normal heart sounds.  Good peripheral circulation. Respiratory: Normal respiratory effort.  No retractions. Lungs CTAB. Musculoskeletal: On examination the right shoulder there is no gross deformity or soft tissue swelling. There is no point tenderness and no crepitus with range of motion. Patient is only slightly restricted  with abduction above a 45 angle. Pulses positive. Muscle strength is normal. Neurologic:  Normal speech and language. No gross focal neurologic deficits are appreciated.  Skin:  Skin is warm, dry and intact. No erythema, abrasions or ecchymosis noted. Psychiatric: Mood and affect are normal. Speech and behavior are normal.  ____________________________________________   LABS (all labs ordered are listed, but only abnormal results are displayed)  Labs Reviewed - No data to display RADIOLOGY  Dg Shoulder Right  Result Date: 01/24/2017 CLINICAL DATA:  26 y/o  M; 3 days of  right shoulder pain. EXAM: RIGHT SHOULDER - 2+ VIEW COMPARISON:  04/24/2013 right shoulder radiographs FINDINGS: There is no evidence of fracture or dislocation. There is no evidence of arthropathy or other focal bone abnormality. Soft tissues are unremarkable. IMPRESSION: Negative. Electronically Signed   By: Mitzi HansenLance  Furusawa-Stratton M.D.   On: 01/24/2017 14:19    ____________________________________________   PROCEDURES  Procedure(s) performed: None  Procedures  Critical Care performed: No  ____________________________________________   INITIAL IMPRESSION / ASSESSMENT AND PLAN / ED COURSE  Pertinent labs & imaging results that were available during my care of the patient were reviewed by me and considered in my medical decision making (see chart for details).  Patient is follow-up with his PCP or Dr. Ernest PineHooten if any continued problems. He was given a prescription for etodolac 400 mg twice a day and Robaxin 500 mg 1 tablet 4 times a day. He is to use ice or heat to the shoulder as needed for comfort. Patient was given note for work.   ___________________________________________   FINAL CLINICAL IMPRESSION(S) / ED DIAGNOSES  Final diagnoses:  Acute pain of right shoulder      NEW MEDICATIONS STARTED DURING THIS VISIT:  Discharge Medication List as of 01/24/2017  2:47 PM    START taking these medications   Details  etodolac (LODINE) 400 MG tablet Take 1 tablet (400 mg total) by mouth 2 (two) times daily., Starting Mon 01/24/2017, Print    methocarbamol (ROBAXIN) 500 MG tablet Take 1 tablet (500 mg total) by mouth 4 (four) times daily., Starting Mon 01/24/2017, Print         Note:  This document was prepared using Dragon voice recognition software and may include unintentional dictation errors.    Tommi RumpsSummers, Demetric Parslow L, PA-C 01/24/17 1525    Emily FilbertWilliams, Jonathan E, MD 01/25/17 [phone]

## 2017-01-24 NOTE — ED Notes (Signed)
See triage note  States he developed right shoulder  Pain several weeks ago  Denies any injury  But states he does a lot of work using his right arm  No deformity noted   Good pulses and sensation

## 2017-01-24 NOTE — ED Triage Notes (Signed)
States right shoulder pain, states hx of same, states he works a lot with his arms

## 2017-03-14 ENCOUNTER — Encounter: Payer: Self-pay | Admitting: *Deleted

## 2017-03-14 ENCOUNTER — Emergency Department: Payer: Self-pay

## 2017-03-14 ENCOUNTER — Emergency Department
Admission: EM | Admit: 2017-03-14 | Discharge: 2017-03-14 | Disposition: A | Discharge status: Home / Self Care | Payer: Self-pay | Attending: Emergency Medicine | Admitting: Emergency Medicine

## 2017-03-14 DIAGNOSIS — I10 Essential (primary) hypertension: Secondary | ICD-10-CM | POA: Insufficient documentation

## 2017-03-14 DIAGNOSIS — B9789 Other viral agents as the cause of diseases classified elsewhere: Secondary | ICD-10-CM | POA: Insufficient documentation

## 2017-03-14 DIAGNOSIS — F17210 Nicotine dependence, cigarettes, uncomplicated: Secondary | ICD-10-CM | POA: Insufficient documentation

## 2017-03-14 DIAGNOSIS — J209 Acute bronchitis, unspecified: Secondary | ICD-10-CM | POA: Insufficient documentation

## 2017-03-14 DIAGNOSIS — J029 Acute pharyngitis, unspecified: Secondary | ICD-10-CM | POA: Insufficient documentation

## 2017-03-14 DIAGNOSIS — Z79899 Other long term (current) drug therapy: Secondary | ICD-10-CM | POA: Insufficient documentation

## 2017-03-14 LAB — POCT RAPID STREP A: STREPTOCOCCUS, GROUP A SCREEN (DIRECT): NEGATIVE

## 2017-03-14 MED ORDER — PSEUDOEPH-BROMPHEN-DM 30-2-10 MG/5ML PO SYRP: 10.0000 mL | ORAL_SOLUTION | Freq: Four times a day (QID) | ORAL | 0 refills | Status: DC | PRN

## 2017-03-14 MED ORDER — PREDNISONE 50 MG PO TABS: 50.0000 mg | ORAL_TABLET | Freq: Every day | ORAL | 0 refills | Status: DC

## 2017-03-14 MED ORDER — ALBUTEROL SULFATE HFA 108 (90 BASE) MCG/ACT IN AERS: 2.0000 | INHALATION_SPRAY | RESPIRATORY_TRACT | 0 refills | Status: DC | PRN

## 2017-03-14 NOTE — ED Notes (Signed)
Cough and sore throat x1wk, denies taking anything OTC.

## 2017-03-14 NOTE — ED Triage Notes (Signed)
States cough and sore throat for 1 week, states feeling worse at night

## 2017-03-14 NOTE — ED Provider Notes (Signed)
[facility] Emergency Department Provider Note  ____________________________________________  Time seen: Approximately 4:03 PM  I have reviewed the triage vital signs and the nursing notes.   HISTORY  Chief Complaint Cough and Sore Throat    HPI Garrett Adams is a 26 y.o. male who presents the emergency department complaining of a week long history of sore throat, cough.  Patient reports that symptoms have been worsening over the past several days.  Patient has not tried any medication for same.  He denies any nasal congestion, ear pain, shortness of breath, chest pain, abdominal pain, nausea vomiting, diarrhea or constipation.  Past Medical History:  Diagnosis Date  . Hypertension   . Seizures (HCC)     There are no active problems to display for this patient.   History reviewed. No pertinent surgical history.  Prior to Admission medications   Medication Sig Start Date End Date Taking? Authorizing Provider  albuterol (PROVENTIL HFA;VENTOLIN HFA) 108 (90 Base) MCG/ACT inhaler Inhale 2 puffs into the lungs every 4 (four) hours as needed for wheezing or shortness of breath. 03/14/17   Cuthriell, Delorise RoyalsJonathan D, PA-C  brompheniramine-pseudoephedrine-DM 30-2-10 MG/5ML syrup Take 10 mLs by mouth 4 (four) times daily as needed. 03/14/17   Cuthriell, Delorise RoyalsJonathan D, PA-C  etodolac (LODINE) 400 MG tablet Take 1 tablet (400 mg total) by mouth 2 (two) times daily. 01/24/17   Tommi RumpsSummers, Rhonda L, PA-C  lansoprazole (PREVACID) 30 MG capsule Take 1 capsule (30 mg total) by mouth daily. 12/21/16 12/21/17  Emily FilbertWilliams, Jonathan E, MD  LORazepam (ATIVAN) 1 MG tablet Take 1 tablet (1 mg total) by mouth 2 (two) times daily. 12/21/16 12/21/17  Emily FilbertWilliams, Jonathan E, MD  methocarbamol (ROBAXIN) 500 MG tablet Take 1 tablet (500 mg total) by mouth 4 (four) times daily. 01/24/17   Tommi RumpsSummers, Rhonda L, PA-C  predniSONE (DELTASONE) 50 MG tablet Take 1 tablet (50 mg total) by mouth daily with breakfast.  03/14/17   Cuthriell, Delorise RoyalsJonathan D, PA-C    Allergies Patient has no known allergies.  History reviewed. No pertinent family history.  Social History Social History  Substance Use Topics  . Smoking status: Current Every Day Smoker    Packs/day: 1.00    Types: Cigarettes  . Smokeless tobacco: Not on file  . Alcohol use Yes     Comment: occasional     Review of Systems  Constitutional: No fever/chills Eyes: No visual changes. No discharge ENT: Positive for sore throat Cardiovascular: no chest pain. Respiratory: Positive cough. No SOB. Gastrointestinal: No abdominal pain.  No nausea, no vomiting.  No diarrhea.  No constipation. Musculoskeletal: Negative for musculoskeletal pain. Skin: Negative for rash, abrasions, lacerations, ecchymosis. Neurological: Negative for headaches, focal weakness or numbness. 10-point ROS otherwise negative.  ____________________________________________   PHYSICAL EXAM:  VITAL SIGNS: ED Triage Vitals  Enc Vitals Group     BP 03/14/17 1432 (!) 159/94     Pulse Rate 03/14/17 1432 93     Resp 03/14/17 1432 18     Temp 03/14/17 1432 98.6 F (37 C)     Temp Source 03/14/17 1432 Oral     SpO2 03/14/17 1432 97 %     Weight 03/14/17 1431 250 lb (113.4 kg)     Height 03/14/17 1431 6' (1.829 m)     Head Circumference --      Peak Flow --      Pain Score 03/14/17 1431 7     Pain Loc --  Pain Edu? --      Excl. in GC? --      Constitutional: Alert and oriented. Well appearing and in no acute distress. Eyes: Conjunctivae are normal. PERRL. EOMI. Head: Atraumatic. ENT:      Ears: EACs and TMs unremarkable.      Nose: No congestion/rhinnorhea.      Mouth/Throat: Mucous membranes are moist.  Oropharynx is mildly erythematous but nonedematous.  Tonsils are mildly erythematous and edematous but no exudates.  Uvula is midline. Neck: No stridor. Hematological/Lymphatic/Immunilogical: No cervical lymphadenopathy. Cardiovascular: Normal rate,  regular rhythm. Normal S1 and S2.  Good peripheral circulation. Respiratory: Normal respiratory effort without tachypnea or retractions. Lungs with a few scattered expiratory wheezes.  No rales or rhonchi. Good air entry to the bases with no decreased or absent breath sounds. Musculoskeletal: Full range of motion to all extremities. No gross deformities appreciated. Neurologic:  Normal speech and language. No gross focal neurologic deficits are appreciated.  Skin:  Skin is warm, dry and intact. No rash noted. Psychiatric: Mood and affect are normal. Speech and behavior are normal. Patient exhibits appropriate insight and judgement.   ____________________________________________   LABS (all labs ordered are listed, but only abnormal results are displayed)  Labs Reviewed  POCT RAPID STREP A   ____________________________________________  EKG   ____________________________________________  RADIOLOGY Festus Barren Cuthriell, personally viewed and evaluated these images (plain radiographs) as part of my medical decision making, as well as reviewing the written report by the radiologist.  Dg Chest 2 View  Result Date: 03/14/2017 CLINICAL DATA:  Cough and sore throat EXAM: CHEST  2 VIEW COMPARISON:  December 21, 2016 FINDINGS: The lungs are clear. Heart size and pulmonary vascularity are normal. No adenopathy. No bone lesions. IMPRESSION: No edema or consolidation. Electronically Signed   By: Bretta Bang III M.D.   On: 03/14/2017 15:46    ____________________________________________    PROCEDURES  Procedure(s) performed:    Procedures    Medications - No data to display   ____________________________________________   INITIAL IMPRESSION / ASSESSMENT AND PLAN / ED COURSE  Pertinent labs & imaging results that were available during my care of the patient were reviewed by me and considered in my medical decision making (see chart for details).  Review of the [place] CSRS  was performed in accordance of the [facility] prior to dispensing any controlled drugs.     Patient's diagnosis is consistent with viral pharyngitis with bronchitis.  Differential included sinusitis, viral URI, strep pharyngitis, viral pharyngitis, pneumonia, bronchitis.  Negative strep test in the emergency department.  Negative on the [facility] criteria.  X-ray is negative for consolidation consistent with pneumonia.  Patient will be discharged home with prescriptions for short prednisone course, albuterol, Bromfed cough syrup. Patient is to follow up with primary care as needed or otherwise directed. Patient is given ED precautions to return to the ED for any worsening or new symptoms.     ____________________________________________  FINAL CLINICAL IMPRESSION(S) / ED DIAGNOSES  Final diagnoses:  Viral pharyngitis  Acute bronchitis, unspecified organism      NEW MEDICATIONS STARTED DURING THIS VISIT:  New Prescriptions   ALBUTEROL (PROVENTIL HFA;VENTOLIN HFA) 108 (90 BASE) MCG/ACT INHALER    Inhale 2 puffs into the lungs every 4 (four) hours as needed for wheezing or shortness of breath.   BROMPHENIRAMINE-PSEUDOEPHEDRINE-DM 30-2-10 MG/5ML SYRUP    Take 10 mLs by mouth 4 (four) times daily as needed.   PREDNISONE (DELTASONE) 50 MG TABLET  Take 1 tablet (50 mg total) by mouth daily with breakfast.        This chart was dictated using voice recognition software/Dragon. Despite best efforts to proofread, errors can occur which can change the meaning. Any change was purely unintentional.    Racheal Patches, PA-C 03/14/17 1612    Sharman Cheek, MD 03/14/17 [phone]

## 2017-03-15 ENCOUNTER — Telehealth: Payer: Self-pay | Admitting: Emergency Medicine

## 2017-03-15 NOTE — Telephone Encounter (Addendum)
Called patient to inform that the throat swab sent to lab read positive for strep a--trial kit.  Per dr Jimmye Norman would like patient to take a z pack and can call to patient preferred pharmacy.  I left message for patient to call me back.  Patient called me back.  Called z pack to [facility].

## 2017-04-29 ENCOUNTER — Emergency Department: Payer: Self-pay

## 2017-04-29 ENCOUNTER — Encounter: Payer: Self-pay | Admitting: Emergency Medicine

## 2017-04-29 ENCOUNTER — Other Ambulatory Visit: Payer: Self-pay

## 2017-04-29 ENCOUNTER — Emergency Department
Admission: EM | Admit: 2017-04-29 | Discharge: 2017-04-29 | Disposition: A | Discharge status: Home / Self Care | Payer: Self-pay | Attending: Student in an Organized Health Care Education/Training Program | Admitting: Student in an Organized Health Care Education/Training Program

## 2017-04-29 DIAGNOSIS — Z79899 Other long term (current) drug therapy: Secondary | ICD-10-CM | POA: Insufficient documentation

## 2017-04-29 DIAGNOSIS — F17210 Nicotine dependence, cigarettes, uncomplicated: Secondary | ICD-10-CM | POA: Insufficient documentation

## 2017-04-29 DIAGNOSIS — J9801 Acute bronchospasm: Secondary | ICD-10-CM | POA: Insufficient documentation

## 2017-04-29 DIAGNOSIS — R0789 Other chest pain: Secondary | ICD-10-CM | POA: Insufficient documentation

## 2017-04-29 DIAGNOSIS — I10 Essential (primary) hypertension: Secondary | ICD-10-CM | POA: Insufficient documentation

## 2017-04-29 MED ORDER — BENZONATATE 100 MG PO CAPS: 200.0000 mg | ORAL_CAPSULE | Freq: Three times a day (TID) | ORAL | 0 refills | Status: DC | PRN

## 2017-04-29 MED ORDER — HYDROCOD POLST-CPM POLST ER 10-8 MG/5ML PO SUER: 5.0000 mL | Freq: Every evening | ORAL | 0 refills | Status: DC | PRN

## 2017-04-29 MED ORDER — METHYLPREDNISOLONE 4 MG PO TBPK: ORAL_TABLET | ORAL | 0 refills | Status: DC

## 2017-04-29 MED ORDER — NAPROXEN 500 MG PO TABS
500.0000 mg | ORAL_TABLET | Freq: Once | ORAL | Status: AC
  Administered 2017-04-29: 500 mg via ORAL
  Filled 2017-04-29: qty 1

## 2017-04-29 MED ORDER — BENZONATATE 100 MG PO CAPS
200.0000 mg | ORAL_CAPSULE | Freq: Once | ORAL | Status: AC
  Administered 2017-04-29: 200 mg via ORAL
  Filled 2017-04-29: qty 2

## 2017-04-29 NOTE — ED Notes (Addendum)
Pt presents today with cough, that started since dx of strep in October. Pt states he has only taking medication that was prescribed to him here. Pt states that last dose he took was 2 days ago.Pt is NAD. Pt denies fever, n/v, but states that cough has caused him to vomit 1 or 2 times. Pt states back and ribs hurt r/t to coughing so much. Pt is awaiting EDP.

## 2017-04-29 NOTE — ED Triage Notes (Signed)
Seen through ED one month ago and treated for sore throat.  States cough never resolved.  Here today with c/o cough.

## 2017-04-29 NOTE — ED Provider Notes (Signed)
[facility] Emergency Department Provider Note   ____________________________________________   First MD Initiated Contact with Patient 04/29/17 [phone]     (approximate)  I have reviewed the triage vital signs and the nursing notes.   HISTORY  Chief Complaint Cough    HPI Garrett Adams is a 26 y.o. male patient complaining a nonproductive cough for 1 month. Patient state he was here one month ago and was diagnosed with strep pharyngitis and was treated for sore throat but the cough  persists. Patient say coughingspells and has caused right lateral chest wall pain. Patient denies fevers chills associated this complaint. Patient denies nausea, vomiting, or diarrhea. No relief with over-the-counter cough preparations. Patient rates his pain as a 7/10. Patient described a pain as "achy".   Past Medical History:  Diagnosis Date  . Hypertension   . Seizures (HCC)     There are no active problems to display for this patient.   History reviewed. No pertinent surgical history.  Prior to Admission medications   Medication Sig Start Date End Date Taking? Authorizing Provider  albuterol (PROVENTIL HFA;VENTOLIN HFA) 108 (90 Base) MCG/ACT inhaler Inhale 2 puffs into the lungs every 4 (four) hours as needed for wheezing or shortness of breath. 03/14/17   Cuthriell, Delorise RoyalsJonathan D, PA-C  benzonatate (TESSALON PERLES) 100 MG capsule Take 2 capsules (200 mg total) by mouth 3 (three) times daily as needed for cough. 04/29/17 04/29/18  Joni ReiningSmith, Ronald K, PA-C  brompheniramine-pseudoephedrine-DM 30-2-10 MG/5ML syrup Take 10 mLs by mouth 4 (four) times daily as needed. 03/14/17   Cuthriell, Delorise RoyalsJonathan D, PA-C  chlorpheniramine-HYDROcodone (TUSSIONEX PENNKINETIC ER) 10-8 MG/5ML SUER Take 5 mLs by mouth at bedtime as needed for cough. 04/29/17   Joni ReiningSmith, Ronald K, PA-C  etodolac (LODINE) 400 MG tablet Take 1 tablet (400 mg total) by mouth 2 (two) times daily. 01/24/17   Tommi RumpsSummers,  Rhonda L, PA-C  lansoprazole (PREVACID) 30 MG capsule Take 1 capsule (30 mg total) by mouth daily. 12/21/16 12/21/17  Emily FilbertWilliams, Jonathan E, MD  LORazepam (ATIVAN) 1 MG tablet Take 1 tablet (1 mg total) by mouth 2 (two) times daily. 12/21/16 12/21/17  Emily FilbertWilliams, Jonathan E, MD  methocarbamol (ROBAXIN) 500 MG tablet Take 1 tablet (500 mg total) by mouth 4 (four) times daily. 01/24/17   Tommi RumpsSummers, Rhonda L, PA-C  methylPREDNISolone (MEDROL DOSEPAK) 4 MG TBPK tablet Take Tapered dose as directed 04/29/17   Joni ReiningSmith, Ronald K, PA-C  predniSONE (DELTASONE) 50 MG tablet Take 1 tablet (50 mg total) by mouth daily with breakfast. 03/14/17   Cuthriell, Delorise RoyalsJonathan D, PA-C    Allergies Patient has no known allergies.  No family history on file.  Social History Social History   Tobacco Use  . Smoking status: Current Every Day Smoker    Packs/day: 1.00    Types: Cigarettes  . Smokeless tobacco: Never Used  Substance Use Topics  . Alcohol use: Yes    Comment: occasional  . Drug use: Not on file    Review of Systems Constitutional: No fever/chills ENT: No sore throat. Cardiovascular: Denies chest pain. Respiratory: Denies shortness of breath. Nonproductive cough  Gastrointestinal: No abdominal pain.  No nausea, no vomiting.  No diarrhea.  No constipation. Genitourinary: Negative for dysuria. Musculoskeletal:Chest wall pain secondary to cough. Skin: Negative for rash. Neurological: Negative for headaches, focal weakness or numbness.   ____________________________________________   PHYSICAL EXAM:  VITAL SIGNS: ED Triage Vitals  Enc Vitals Group     BP 04/29/17 0928 128/88  Pulse Rate 04/29/17 0928 94     Resp 04/29/17 0928 18     Temp 04/29/17 0928 97.9 F (36.6 C)     Temp Source 04/29/17 0928 Oral     SpO2 04/29/17 0928 94 %     Weight 04/29/17 0926 250 lb (113.4 kg)     Height 04/29/17 0926 6' (1.829 m)     Head Circumference --      Peak Flow --      Pain Score 04/29/17 0926 7     Pain  Loc --      Pain Edu? --      Excl. in GC? --    Constitutional: Alert and oriented. Well appearing and in no acute distress. Nose: No congestion/rhinnorhea. Mouth/Throat: Mucous membranes are moist.  Oropharynx non-erythematous. Neck: No stridor. Hematological/Lymphatic/Immunilogical: No cervical lymphadenopathy. Cardiovascular: Normal rate, regular rhythm. Grossly normal heart sounds.  Good peripheral circulation. Respiratory: Normal respiratory effort.  No retractions.Lungs with bilateral rhonchi. Gastrointestinal: Soft and nontender. No distention. No abdominal bruits. No CVA tenderness. Neurologic:  Normal speech and language. No gross focal neurologic deficits are appreciated. No gait instability. Skin:  Skin is warm, dry and intact. No rash noted. Psychiatric: Mood and affect are normal. Speech and behavior are normal.  ____________________________________________   LABS (all labs ordered are listed, but only abnormal results are displayed)  Labs Reviewed - No data to display ____________________________________________  EKG   ____________________________________________  RADIOLOGY  Dg Chest 2 View  Result Date: 04/29/2017 CLINICAL DATA:  Nonproductive cough for 1 month.  Bilateral rhonchi. EXAM: CHEST  2 VIEW COMPARISON:  03/14/2017 FINDINGS: The heart size and mediastinal contours are within normal limits. Both lungs are clear. The visualized skeletal structures are unremarkable. IMPRESSION: No active cardiopulmonary disease. Electronically Signed   By: Richarda OverlieAdam  Henn M.D.   On: 04/29/2017 10:05    ____________________________________________   PROCEDURES  Procedure(s) performed: None  Procedures  Critical Care performed: No  ____________________________________________   INITIAL IMPRESSION / ASSESSMENT AND PLAN / ED COURSE  As part of my medical decision making, I reviewed the following data within the electronic MEDICAL RECORD NUMBER    Cough  Secondary to  bronchospasm. Discussed negative chest x-ray finding with patient. Patient given discharge care instructions and advised take medication as directed. Patient advised follow-up with [facility] if condition persists.      ____________________________________________   FINAL CLINICAL IMPRESSION(S) / ED DIAGNOSES  Final diagnoses:  Cough due to bronchospasm     ED Discharge Orders        Ordered    methylPREDNISolone (MEDROL DOSEPAK) 4 MG TBPK tablet     04/29/17 1014    benzonatate (TESSALON PERLES) 100 MG capsule  3 times daily PRN     04/29/17 1014    chlorpheniramine-HYDROcodone (TUSSIONEX PENNKINETIC ER) 10-8 MG/5ML SUER  At bedtime PRN     04/29/17 1014       Note:  This document was prepared using Dragon voice recognition software and may include unintentional dictation errors.    Joni ReiningSmith, Ronald K, PA-C 04/29/17 1026    Willy Eddyobinson, Patrick, MD 04/29/17 [phone]

## 2018-01-17 ENCOUNTER — Emergency Department
Admission: EM | Admit: 2018-01-17 | Discharge: 2018-01-17 | Disposition: A | Discharge status: Home / Self Care | Payer: Self-pay | Attending: Emergency Medicine | Admitting: Emergency Medicine

## 2018-01-17 ENCOUNTER — Emergency Department: Payer: Self-pay

## 2018-01-17 ENCOUNTER — Encounter: Payer: Self-pay | Admitting: Emergency Medicine

## 2018-01-17 DIAGNOSIS — I10 Essential (primary) hypertension: Secondary | ICD-10-CM | POA: Insufficient documentation

## 2018-01-17 DIAGNOSIS — Z79899 Other long term (current) drug therapy: Secondary | ICD-10-CM | POA: Insufficient documentation

## 2018-01-17 DIAGNOSIS — Y92480 Sidewalk as the place of occurrence of the external cause: Secondary | ICD-10-CM | POA: Insufficient documentation

## 2018-01-17 DIAGNOSIS — F17210 Nicotine dependence, cigarettes, uncomplicated: Secondary | ICD-10-CM | POA: Insufficient documentation

## 2018-01-17 DIAGNOSIS — Y9301 Activity, walking, marching and hiking: Secondary | ICD-10-CM | POA: Insufficient documentation

## 2018-01-17 DIAGNOSIS — W010XXA Fall on same level from slipping, tripping and stumbling without subsequent striking against object, initial encounter: Secondary | ICD-10-CM | POA: Insufficient documentation

## 2018-01-17 DIAGNOSIS — M25562 Pain in left knee: Secondary | ICD-10-CM

## 2018-01-17 DIAGNOSIS — S93401A Sprain of unspecified ligament of right ankle, initial encounter: Secondary | ICD-10-CM | POA: Insufficient documentation

## 2018-01-17 DIAGNOSIS — Y999 Unspecified external cause status: Secondary | ICD-10-CM | POA: Insufficient documentation

## 2018-01-17 DIAGNOSIS — S82142A Displaced bicondylar fracture of left tibia, initial encounter for closed fracture: Secondary | ICD-10-CM | POA: Insufficient documentation

## 2018-01-17 MED ORDER — CYCLOBENZAPRINE HCL 5 MG PO TABS: 5.0000 mg | ORAL_TABLET | Freq: Three times a day (TID) | ORAL | 0 refills | Status: DC | PRN

## 2018-01-17 MED ORDER — NABUMETONE 750 MG PO TABS: 750.0000 mg | ORAL_TABLET | Freq: Two times a day (BID) | ORAL | 0 refills | Status: DC

## 2018-01-17 NOTE — ED Notes (Signed)
See triage note  Presents with pain to right ankle  States he rolled his ankle when he stepped off curb  Swelling noted   Good pulses is able to bear wt  Ambulates with limp d/t pain

## 2018-01-17 NOTE — Discharge Instructions (Addendum)
Your exam following your fall shows a moderate ankle sprain, and a possible fracture to the tibia. Your are being treated with a knee immobilizer for the knee. You should be further evaluated with a CT scan of the knee, but you declined that test today. Wear the splint and immobilizer and follow-up with orthopedics or return to the Emergency Department for ongoing symptoms. Take the prescription meds as directed.

## 2018-01-17 NOTE — ED Provider Notes (Signed)
[facility] Emergency Department Provider Note ____________________________________________  Time seen: 1330  I have reviewed the triage vital signs and the nursing notes.  HISTORY  Chief Complaint  Knee Pain and Ankle Pain  HPI Garrett Adams is a 27 y.o. male who presents himself to the ED for evaluation of pain following a mechanical fall yesterday.  Patient describes stepping into a divot at the side of his sidewalk, causing him to twist the right ankle and fall onto his left knee.  He presents today with swelling to the left knee as well as some lateral swelling to the right ankle.  He denies any head injury, loss of consciousness, nausea, vomiting, dizziness.  He notes pain and disability with ambulation on the right ankle.  He has a remote history of previous ankle sprains but denies any fractures or dislocations to the joints.  He took Tylenol and ibuprofen yesterday but denies any significant benefit.  He is here today for further management of his symptoms.  Past Medical History:  Diagnosis Date  . Hypertension   . Seizures (HCC)     There are no active problems to display for this patient.   History reviewed. No pertinent surgical history.  Prior to Admission medications   Medication Sig Start Date End Date Taking? Authorizing Provider  albuterol (PROVENTIL HFA;VENTOLIN HFA) 108 (90 Base) MCG/ACT inhaler Inhale 2 puffs into the lungs every 4 (four) hours as needed for wheezing or shortness of breath. 03/14/17   Cuthriell, Delorise Royals, PA-C  cyclobenzaprine (FLEXERIL) 5 MG tablet Take 1 tablet (5 mg total) by mouth 3 (three) times daily as needed for muscle spasms. 01/17/18   Delbert Vu, Charlesetta Ivory, PA-C  nabumetone (RELAFEN) 750 MG tablet Take 1 tablet (750 mg total) by mouth 2 (two) times daily. 01/17/18   Ky Moskowitz, Charlesetta Ivory, PA-C    Allergies Patient has no known allergies.  No family history on file.  Social History Social History    Tobacco Use  . Smoking status: Current Every Day Smoker    Packs/day: 1.00    Types: Cigarettes  . Smokeless tobacco: Never Used  Substance Use Topics  . Alcohol use: Yes    Comment: occasional  . Drug use: Not on file    Review of Systems  Constitutional: Negative for fever. Eyes: Negative for visual changes. ENT: Negative for sore throat. Cardiovascular: Negative for chest pain. Respiratory: Negative for shortness of breath. Gastrointestinal: Negative for abdominal pain, vomiting and diarrhea. Genitourinary: Negative for dysuria. Musculoskeletal: Negative for back pain.  Left knee and right ankle pain as above. Skin: Negative for rash. Neurological: Negative for headaches, focal weakness or numbness. ____________________________________________  PHYSICAL EXAM:  VITAL SIGNS: ED Triage Vitals [01/17/18 1309]  Enc Vitals Group     BP      Pulse      Resp      Temp      Temp src      SpO2      Weight 250 lb (113.4 kg)     Height 6' (1.829 m)     Head Circumference      Peak Flow      Pain Score      Pain Loc      Pain Edu?      Excl. in GC?     Constitutional: Alert and oriented. Well appearing and in no distress. Head: Normocephalic and atraumatic. Eyes: Conjunctivae are normal. Normal extraocular movements Cardiovascular: Normal rate, regular rhythm.  Normal distal pulses. Respiratory: Normal respiratory effort. No wheezes/rales/rhonchi. Musculoskeletal: Patient's left knee with some subtle erythema and mild prepatellar swelling noted.  Normal flexion/extension range noted.  The right ankle with subtle lateral soft tissue swelling appreciated.  Patient with tenderness palpation over the lateral ligaments and fibular head.  Normal active range of motion in all planes.  Negative anterior/posterior drawer.  No significant calf or Achilles tenderness is appreciated.  Nontender with normal range of motion in all extremities.  Neurologic:  Normal gait without ataxia.  Normal speech and language. No gross focal neurologic deficits are appreciated. Skin:  Skin is warm, dry and intact. No rash noted. ____________________________________________   RADIOLOGY  Left knee IMPRESSION: Subtle cortical irregularity felt to represent a small fracture along the lateral aspect of the medial tibial plateau seen only on a single oblique view. No other findings suggesting fracture. No dislocation or joint effusion. No appreciable arthropathy.  Right ankle IMPRESSION: Soft tissue swelling laterally. No evident fracture or appreciable arthropathy. Ankle mortise appears intact. ____________________________________________  PROCEDURES  Procedures Velcro ankle stirrup splint Knee immobilizer ____________________________________________  INITIAL IMPRESSION / ASSESSMENT AND PLAN / ED COURSE  Patient with ED evaluation of injury sustained following a mechanical fall.  Patient's overall exam is benign.  X-rays of the right ankle are negative for any acute bony injury.  The left knee has a subtle cortical irregularity at the medial plateau, only seen on one view, that is concerning for acute injury.  The patient's exam however does not elicit any pain with weightbearing.  I discussed the usefulness of CT imaging to differentiate an acute bony injury from a pre-existing cortical irregularity.  Patient declines CT imaging at this time.  We will air the side of caution and place him in a knee immobilizer for comfort and support.  He is placed in a knee immobilizer on the left, and given instructions to weight-bear as tolerated.  He reports he has crutches at home that he may use to ambulate.  I referred the patient to Dr. Martha Clan for further evaluation, but he is advised to return to the ED immediately if his knee pain worsens sharply. ____________________________________________  FINAL CLINICAL IMPRESSION(S) / ED DIAGNOSES  Final diagnoses:  Sprain of right ankle,  unspecified ligament, initial encounter  Acute pain of left knee  Tibial plateau fracture, left, closed, initial encounter       Lissa Hoard, PA-C 01/17/18 1759    Minna Antis, MD 01/17/18 1900

## 2018-01-17 NOTE — ED Triage Notes (Signed)
Pt reports last pm stepped off a curb and hurt his right ankle and left knee. Pt reports painful to put weight on it.

## 2019-09-29 ENCOUNTER — Ambulatory Visit: Payer: Self-pay | Admitting: Internal Medicine

## 2019-10-20 ENCOUNTER — Other Ambulatory Visit: Payer: Self-pay

## 2019-10-20 ENCOUNTER — Ambulatory Visit: Payer: Self-pay | Admitting: Internal Medicine

## 2019-10-20 DIAGNOSIS — Z0001 Encounter for general adult medical examination with abnormal findings: Secondary | ICD-10-CM

## 2019-10-20 DIAGNOSIS — R635 Abnormal weight gain: Secondary | ICD-10-CM

## 2019-10-20 DIAGNOSIS — F331 Major depressive disorder, recurrent, moderate: Secondary | ICD-10-CM

## 2019-10-20 NOTE — Progress Notes (Signed)
Patient Name: Garrett Adams  [id number]  [id number]  Date of Service: 10/20/2019  Chief Complaint  Patient presents with  . Weight Gain  . Depression    HPI Mr Golda is connected via audio to establish care. He does not feel well. Tired, depressed anf just does not feel good. He has gained weight as well, he has not seen a doctor in few years, he has diagnosis of depression and has taken 4-5 meds with no success. He is not suicidal. Pt has h/o of one time sz in 2012. On review of his old records there is h/o substance abuse as well    Current Medication: Outpatient Encounter Medications as of 10/20/2019  Medication Sig  . albuterol (PROVENTIL HFA;VENTOLIN HFA) 108 (90 Base) MCG/ACT inhaler Inhale 2 puffs into the lungs every 4 (four) hours as needed for wheezing or shortness of breath.  . cyclobenzaprine (FLEXERIL) 5 MG tablet Take 1 tablet (5 mg total) by mouth 3 (three) times daily as needed for muscle spasms.  . nabumetone (RELAFEN) 750 MG tablet Take 1 tablet (750 mg total) by mouth 2 (two) times daily.   No facility-administered encounter medications on file as of 10/20/2019.    Surgical History: No past surgical history on file.  Medical History: Past Medical History:  Diagnosis Date  . Hypertension   . Seizures ([place])     Family History: No family history on file.  Social History   Socioeconomic History  . Marital status: Single    Spouse name: Not on file  . Number of children: Not on file  . Years of education: Not on file  . Highest education level: Not on file  Occupational History  . Not on file  Tobacco Use  . Smoking status: Current Every Day Smoker    Packs/day: 1.00    Types: Cigarettes  . Smokeless tobacco: Never Used  Substance and Sexual Activity  . Alcohol use: Yes    Comment: occasional  . Drug use: Not on file  . Sexual activity: Not on file  Other Topics Concern  . Not on file  Social History Narrative  . Not on file   Social  Determinants of Health   Financial Resource Strain:   . Difficulty of Paying Living Expenses:   Food Insecurity:   . Worried About Charity fundraiser in the Last Year:   . Arboriculturist in the Last Year:   Transportation Needs:   . Film/video editor (Medical):   Marland Kitchen Lack of Transportation (Non-Medical):   Physical Activity:   . Days of Exercise per Week:   . Minutes of Exercise per Session:   Stress:   . Feeling of Stress :   Social Connections:   . Frequency of Communication with Friends and Family:   . Frequency of Social Gatherings with Friends and Family:   . Attends Religious Services:   . Active Member of Clubs or Organizations:   . Attends Archivist Meetings:   Marland Kitchen Marital Status:   Intimate Partner Violence:   . Fear of Current or Ex-Partner:   . Emotionally Abused:   Marland Kitchen Physically Abused:   . Sexually Abused:       Review of Systems  Constitutional: Positive for fatigue. Negative for chills and unexpected weight change.       Weight gain   HENT: Negative for congestion, postnasal drip, rhinorrhea, sneezing and sore throat.   Eyes: Negative for redness.  Respiratory: Negative  for cough, chest tightness and shortness of breath.   Cardiovascular: Negative for chest pain and palpitations.  Gastrointestinal: Negative for abdominal pain, constipation, diarrhea, nausea and vomiting.  Genitourinary: Negative for dysuria and frequency.  Musculoskeletal: Negative for arthralgias, back pain, joint swelling and neck pain.  Skin: Negative for rash.  Neurological: Negative.  Negative for tremors and numbness.  Hematological: Negative for adenopathy. Does not bruise/bleed easily.  Psychiatric/Behavioral: Positive for dysphoric mood. Negative for behavioral problems (Depression), sleep disturbance and suicidal ideas. The patient is nervous/anxious.     Vital Signs: There were no vitals taken for this visit.   Physical Exam  No exam is preformed     Assessment/Plan: 1. Weight gain - Might be caloric excess. Will check TSH   2. Moderate episode of recurrent major depressive disorder (HCC) - Needs treatment, however has tried multiple meds in the past. Drug abuse potential, will need constant monitoring, will need further eval for bipolar as well   3. Encounter for general adult medical examination with abnormal findings - CBC with Differential/Platelet; Future - Lipid Panel With LDL/HDL Ratio; Future - TSH; Future - T4, free; Future - Comprehensive metabolic panel   General Counseling: mikle sternberg understanding of the findings of todays visit and agrees with plan of treatment. I have discussed any further diagnostic evaluation that may be needed or ordered today. We also reviewed his medications today. he has been encouraged to call the office with any questions or concerns that should arise related to todays visit.    Orders Placed This Encounter  Procedures  . CBC with Differential/Platelet  . Lipid Panel With LDL/HDL Ratio  . TSH  . T4, free  . Comprehensive metabolic panel     Total time spent: Time spent includes review of chart, medications, test results, and follow up plan with the patient.  Dr Lyndon Code Internal medicine

## 2019-11-23 ENCOUNTER — Other Ambulatory Visit: Payer: Self-pay

## 2019-11-23 DIAGNOSIS — F489 Nonpsychotic mental disorder, unspecified: Secondary | ICD-10-CM | POA: Insufficient documentation

## 2019-11-23 DIAGNOSIS — Z8739 Personal history of other diseases of the musculoskeletal system and connective tissue: Secondary | ICD-10-CM | POA: Insufficient documentation

## 2019-11-24 ENCOUNTER — Ambulatory Visit: Payer: Self-pay | Admitting: Adult Health

## 2019-12-22 ENCOUNTER — Ambulatory Visit: Payer: Self-pay | Admitting: Internal Medicine

## 2020-01-05 ENCOUNTER — Ambulatory Visit: Payer: Self-pay | Admitting: Internal Medicine

## 2023-08-25 ENCOUNTER — Encounter: Payer: Self-pay | Admitting: Psychiatry

## 2023-08-25 ENCOUNTER — Other Ambulatory Visit: Payer: Self-pay

## 2023-08-25 ENCOUNTER — Ambulatory Visit (INDEPENDENT_AMBULATORY_CARE_PROVIDER_SITE_OTHER): Admitting: Psychiatry

## 2023-08-25 VITALS — BP 157/117 | HR 87 | Temp 98.2°F | Ht 72.0 in | Wt 268.2 lb

## 2023-08-25 DIAGNOSIS — F331 Major depressive disorder, recurrent, moderate: Secondary | ICD-10-CM | POA: Diagnosis not present

## 2023-08-25 DIAGNOSIS — F411 Generalized anxiety disorder: Secondary | ICD-10-CM | POA: Diagnosis not present

## 2023-08-25 DIAGNOSIS — F1990 Other psychoactive substance use, unspecified, uncomplicated: Secondary | ICD-10-CM

## 2023-08-25 MED ORDER — HYDROXYZINE HCL 25 MG PO TABS: 25.0000 mg | ORAL_TABLET | Freq: Three times a day (TID) | ORAL | 0 refills | Status: DC | PRN

## 2023-08-25 MED ORDER — GABAPENTIN 300 MG PO CAPS: 300.0000 mg | ORAL_CAPSULE | Freq: Two times a day (BID) | ORAL | 0 refills | Status: DC

## 2023-08-25 MED ORDER — VENLAFAXINE HCL ER 37.5 MG PO CP24: 37.5000 mg | ORAL_CAPSULE | Freq: Every day | ORAL | 0 refills | Status: DC

## 2023-08-25 NOTE — Progress Notes (Addendum)
 Psychiatric Initial Adult Assessment   Patient Identification: Garrett Adams MRN:  [id number] Date of Evaluation:  08/25/2023 Referral Source: Abe People, FNP Chief Complaint:   Chief Complaint  Patient presents with   Establish Care   Visit Diagnosis:    ICD-10-CM   1. Generalized anxiety disorder  F41.1 gabapentin (NEURONTIN) 300 MG capsule    hydrOXYzine (ATARAX) 25 MG tablet    venlafaxine XR (EFFEXOR-XR) 37.5 MG 24 hr capsule    2. Major depressive disorder, recurrent episode, moderate (HCC)  F33.1 venlafaxine XR (EFFEXOR-XR) 37.5 MG 24 hr capsule    3. Substance use disorder  F19.90       History of Present Illness: 33 year old presenting to [facility] for establishing care and medication management.  Patient reports he is a single dad and currently taking care of 2 kids that are 10 and 9 and moved in with his grandma after losing her home and ending at 8-year relationship.  She reports a great amount of anxiety as well as depression due to his 8-year relationship ending 3 years ago that involved cheating and taking primary custody over his kids.  Patient recognizes that these are situational stressors but then stated that he is currently using substances of benzos Klonopin 1 mg a day, cannabis daily and alcohol daily roughly 6 cans.  Patient reports that he has been struggling with anxiety and depression for several years and is requesting medication management.  Patient was educated on substance use and its impact of psychoactive natures that could be interfering with medication therapy in which he verbalized understanding and states that he will cut back on substance use.  Patient reports that he is sleeping 7 to 8 hours daily as well as a regular appetite.  Patient has history of being checked in at a rehab in 2019 due to Suboxone use, stating last use was a couple years ago.  Patient also endorses using cocaine a year ago, heroin and LSD 4 years ago, ecstasy 5 years ago.  Patient  PDMS was reviewed  Based on this assessment and interview it is recommended for the patient be diagnosed with generalized anxiety disorder, major depressive disorder, mild, recurrent and substance use disorder.  Patient will start on Effexor 37.5 mg for 1 month daily and will be reevaluated next month on follow-up for medication adjustment.  Patient is to continue taking gabapentin for anxiety 200 mg twice a day and to continue taking hydroxyzine 25 mg 3 times a day as needed for anxiety.  Patient to stop Klonopin as well as substance use of cannabis, alcohol and benzos.  Patient has been educated that our clinic is not a benzo prescribing clinic and he verbalized understanding.  Patient is in agreement with this treatment plan.  Patient also states that should he have suicidal thoughts with or without plan he will call 911 or go to the emergency department patient also instructed to contact the clinic should he have worsening symptoms before the next appointment.  Patient verbalized agreement and agrees with the treatment plan.  Associated Signs/Symptoms: Depression Symptoms:  depressed mood, feelings of worthlessness/guilt, hopelessness, anxiety, (Hypo) Manic Symptoms: Negative Anxiety Symptoms:  Excessive Worry, Psychotic Symptoms: Negative PTSD Symptoms: Negative  Past Psychiatric History:  Previous Psych Hospitalizations: -2019, admitted as rehab for detox off Suboxone.  Patient was significant history of substance use including heroin and LSD 4 years ago ecstasy 5 years ago Suboxone this past year as well as use of cocaine 1 year ago and cannabis  daily and alcohol daily 6 cans a day.  Outpatient treatment: Currently diagnosed with anxiety and depression.  Medications Current: -Klonopin 1 mg twice a day as needed for anxiety -Gabapentin 300 mg twice a day for anxiety -Hydroxyzine 25 mg twice a day as needed for anxiety  Medication Trials: -2019 Zoloft, poor response was taking with  substance use -2019 citalopram, poor response with substance use -2019 Prozac, poor response with substance use -2019 buspirone, poor response with substance use -2019 Remeron, poor response with substance use  Suicide & Violence: Denies SI, HI, AVH.  Patient also denies previous suicide attempts.  Psychotherapy: Patient currently recommended for psychotherapy with Lanora Manis at [facility] patient placed on waiting list.  Legal: Denies current any legal issues.  Previous Psychotropic Medications: Yes   Substance Abuse History in the last 12 months:  Yes.    Consequences of Substance Abuse: Medical Consequences:  Patient was advised that SSRIs as well as SNRIs will not be effective should substance use be consistent and being used while on therapy.  Patient also educated that the substance use is recognized as a problem as the patient is using of marijuana daily, alcohol 6 cans a day and benzo use of Klonopin 1 mg daily. Legal Consequences:  Patient advised on the risk of losing jobs should he find out he is having substance use in which the patient verbalized understanding and is stating that he will has interest in quitting.  Past Medical History:  Past Medical History:  Diagnosis Date   Hypertension    Seizures (HCC)    History reviewed. No pertinent surgical history.  Family Psychiatric History: Father, alcoholic  Family History:  Family History  Problem Relation Age of Onset   Diabetes Maternal Grandfather    Alzheimer's disease Maternal Grandfather    Parkinson's disease Maternal Grandfather    Neuropathy Maternal Grandfather    High Cholesterol Maternal Grandfather    Diabetes Paternal Grandfather    Cancer Paternal Grandfather     Social History:   Social History   Socioeconomic History   Marital status: Single    Spouse name: Not on file   Number of children: 2   Years of education: Not on file   Highest education level: Some college, no degree  Occupational  History   Occupation: food Press photographer  Tobacco Use   Smoking status: Every Day    Current packs/day: 1.00    Types: Cigarettes   Smokeless tobacco: Never  Vaping Use   Vaping status: Never Used  Substance and Sexual Activity   Alcohol use: Yes    Comment: daily   Drug use: Yes    Types: Marijuana   Sexual activity: Yes    Partners: Female    Birth control/protection: Condom  Other Topics Concern   Not on file  Social History Narrative   Not on file   Social Drivers of Health   Financial Resource Strain: Not on file  Food Insecurity: Not on file  Transportation Needs: Not on file  Physical Activity: Not on file  Stress: Not on file  Social Connections: Not on file    Additional Social History: No additional social history  Allergies:  No Known Allergies  Metabolic Disorder Labs: No results found for: "HGBA1C", "MPG" No results found for: "PROLACTIN" No results found for: "CHOL", "TRIG", "HDL", "CHOLHDL", "VLDL", "LDLCALC" No results found for: "TSH"  Therapeutic Level Labs: No results found for: "LITHIUM" No results found for: "CBMZ" No results found for: "VALPROATE"  Current Medications: Current Outpatient Medications  Medication Sig Dispense Refill   gabapentin (NEURONTIN) 300 MG capsule Take 300 mg by mouth 2 (two) times daily.     hydrOXYzine (ATARAX) 25 MG tablet 2 (two) times daily at 10 AM and 5 PM.     losartan (COZAAR) 50 MG tablet Take 50 mg by mouth daily.     montelukast (SINGULAIR) 10 MG tablet Take 10 mg by mouth daily.     pantoprazole (PROTONIX) 40 MG tablet Take 40 mg by mouth 2 (two) times daily.     VENTOLIN HFA 108 (90 Base) MCG/ACT inhaler as directed.     acetaminophen (TYLENOL) 500 MG tablet Take by mouth as needed.     No current facility-administered medications for this visit.    Musculoskeletal: Strength & Muscle Tone: within normal limits Gait & Station: normal Patient leans: N/A  Psychiatric Specialty Exam: Review of Systems   Constitutional: Negative.   HENT: Negative.    Eyes: Negative.   Respiratory: Negative.    Cardiovascular: Negative.   Gastrointestinal: Negative.   Endocrine: Negative.   Genitourinary: Negative.   Musculoskeletal:  Positive for back pain and myalgias.  Skin: Negative.   Allergic/Immunologic: Negative.   Neurological: Negative.   Hematological: Negative.   Psychiatric/Behavioral:  The patient is nervous/anxious.     Blood pressure (!) 157/117, pulse 87, temperature 98.2 F (36.8 C), temperature source Temporal, height 6' (1.829 m), weight 268 lb 3.2 oz (121.7 kg).Body mass index is 36.37 kg/m.  General Appearance: Well Groomed  Eye Contact:  Good  Speech:  Clear and Coherent  Volume:  Normal  Mood:  Depressed  Affect:  Depressed  Thought Process:  Coherent  Orientation:  Full (Time, Place, and Person)  Thought Content:  Logical  Suicidal Thoughts:  No  Homicidal Thoughts:  No  Memory:  Immediate;   Good Recent;   Good Remote;   Good  Judgement:  Good  Insight:  Good  Psychomotor Activity:  Normal  Concentration:  Concentration: Good and Attention Span: Good  Recall:  Good  Fund of Knowledge:Good  Language: Good  Akathisia:  No  Handed:  Right  AIMS (if indicated):    Assets:  Desire for Improvement Housing Social Support  ADL's:  Intact  Cognition: WNL  Sleep:  Good   Screenings: PHQ2-9    Flowsheet Row Office Visit from 08/25/2023 in [facility]  PHQ-2 Total Score 4  PHQ-9 Total Score 15      Flowsheet Row Office Visit from 08/25/2023 in [facility]  C-SSRS RISK CATEGORY No Risk       Assessment and Plan:  Assessment - Diagnosis: Generalized anxiety disorder [F41.1]  2.  Major depressive disorder, recurrent episode, moderate (HCC) [F33.1]  3. Substance use disorder [F19.90]  Differential Diagnosis: Mood Disorder  - Progress: Baseline appointment - Risk Factors:  Suicide risk, worsening symptoms  Plan - Medications:  Start Effexor 37.5 mg once a day with breakfast, for anxiety due to multiple failed SSRIs and has been educated on headache nervousness and dizziness and was notified to reach out to clinic should he experience symptoms of lasting longer than a week. Continue gabapentin 300 mg twice a day for anxiety prescribed by his PCP.  Continue taking hydroxyzine 25 mg 3 times a day as needed for anxiety.  Patient educated on sedation of medication and advised to not drive while taking medication. Stop Klonopin. - Psychotherapy: Patient has been placed on  therapy list for list and is waiting for available appointment. - Education: Patient has been educated on the substance use stating that the use of marijuana daily as well as alcohol use daily with the positive CAGE assessment is recommended to stop use while on therapy due to psychoactive nature of substances.  Patient has also been educated on the clinic policy on benzos and has been told that he we will not represcribe Klonopin and for the patient to stop using the medication.  Patient is in agreement and states that he would like to cut down and understands that he should be participating in therapy and has been educated on IOP for substance use, and instructed to let provider know if he is interested in the program. - Follow-Up: Patient will follow-up in 1 month - Referrals: No referrals - Safety Planning:  The patient has been educated, if they should have suicidal thoughts with or without a plan to call 911, or go to the closest emergency department.  Pt verbalized understanding.  Pt endorses having a firearm at home stating that it is locked and keeps the ammunition separate and secure in 2 different locations..  Pt also agrees to call the clinic should they have worsening symptoms before the next appointment.      Patient/Guardian was advised Release of Information must be obtained prior to any  record release in order to collaborate their care with an outside provider. Patient/Guardian was advised if they have not already done so to contact the registration department to sign all necessary forms in order for [place] to release information regarding their care.   Consent: Patient/Guardian gives verbal consent for treatment and assignment of benefits for services provided during this visit. Patient/Guardian expressed understanding and agreed to proceed.   Juliann Pares, NP 4/10/20253:11 PM

## 2023-09-14 ENCOUNTER — Telehealth: Payer: Self-pay | Admitting: Psychiatry

## 2023-09-14 NOTE — Telephone Encounter (Signed)
 PT called and left a voicemail stating that he wishes to cancel his 09/19/23 follow up without rescheduling and will continue medications with his regular doctor.

## 2023-09-19 ENCOUNTER — Ambulatory Visit: Admitting: Psychiatry

## 2023-09-28 ENCOUNTER — Encounter: Payer: Self-pay | Admitting: Psychiatry

## 2023-09-28 ENCOUNTER — Other Ambulatory Visit: Payer: Self-pay

## 2023-09-28 ENCOUNTER — Ambulatory Visit (INDEPENDENT_AMBULATORY_CARE_PROVIDER_SITE_OTHER): Admitting: Psychiatry

## 2023-09-28 VITALS — BP 141/93 | HR 105 | Temp 97.7°F | Ht 72.0 in | Wt 259.6 lb

## 2023-09-28 DIAGNOSIS — F411 Generalized anxiety disorder: Secondary | ICD-10-CM | POA: Diagnosis not present

## 2023-09-28 DIAGNOSIS — F3342 Major depressive disorder, recurrent, in full remission: Secondary | ICD-10-CM | POA: Insufficient documentation

## 2023-09-28 MED ORDER — GABAPENTIN 300 MG PO CAPS: 300.0000 mg | ORAL_CAPSULE | Freq: Two times a day (BID) | ORAL | 2 refills | Status: DC

## 2023-09-28 MED ORDER — HYDROXYZINE HCL 25 MG PO TABS: 25.0000 mg | ORAL_TABLET | Freq: Three times a day (TID) | ORAL | 2 refills | Status: DC | PRN

## 2023-09-28 NOTE — Progress Notes (Addendum)
 BH MD/PA/NP OP Progress Note  09/28/2023 2:39 PM Garrett Adams  MRN:  [id number]  Chief Complaint:  Chief Complaint  Patient presents with   Follow-up   HPI: 33 year old male presenting to [facility] for routine follow-up.  Patient reports that he is coming in today to let the provider know that he will only be taking gabapentin  and hydroxyzine .  Patient reports that since taking Effexor  Effexor  has been causing him masking of his emotions as well as for making him "feel like a zombie "patient reports that he stopped on his own accord after about 2 weeks of therapy and reports that he would rather stay off medication because he is feeling much better since stopping.  At this time patient was asked if he had any other concerns or any questions about any other medications in which he did not stating that he would like to see if he can make it work with just the gabapentin  and hydroxyzine  with his other medical meds medications.  Patient reports at this time that his anxiety is controlled with hydroxyzine  and that he needs no other medications.  Patient reports that he does need refills and which refills were provided to the patient.  Patient was educated that I will still remain as a psychiatric provider for this patient will move to maintenance schedule in which we will follow up every 3 months to keep an eye on symptoms.  Patient has been educated to contact the clinic should symptoms become worse or new symptoms prior to the next visit.  Patient has verbalized that he understands he could contact me through my chart or by calling the clinic.  Patient with no other needs or concerns patient agrees to treatment plan.  Patient denies SI, HI, AVH.  Patient will follow up in 3 months Visit Diagnosis:    ICD-10-CM   1. Generalized anxiety disorder  F41.1 gabapentin  (NEURONTIN ) 300 MG capsule    hydrOXYzine  (ATARAX ) 25 MG tablet    2. Recurrent major depressive disorder, in full remission (HCC)  F33.42        Past Psychiatric History:  Previous Psych Hospitalizations: -2019, admitted as rehab for detox off Suboxone.  Patient was significant history of substance use including heroin and LSD 4 years ago ecstasy 5 years ago Suboxone this past year as well as use of cocaine 1 year ago and cannabis daily and alcohol daily 6 cans a day.   Outpatient treatment: Currently diagnosed with anxiety and depression.   Medications Current: -Gabapentin  300 mg twice a day for anxiety -Hydroxyzine  25 mg twice a day as needed for anxiety  Next Steps:  - Currently on maintenance, pt next appointment in 3 months.    Medication Trials: -09/28/23 Effexor , pt reports "numbing, felt like a zombie".  -2019 Zoloft, poor response was taking with substance use -2019 citalopram, poor response with substance use -2019 Prozac, poor response with substance use -2019 buspirone, poor response with substance use -2019 Remeron, poor response with substance use   Suicide & Violence: Denies SI, HI, AVH.  Patient also denies previous suicide attempts.   Psychotherapy: Patient currently recommended for psychotherapy with Nellie Banas at [facility] patient placed on waiting list.   Legal: Denies current any legal issues.  Past Medical History:  Past Medical History:  Diagnosis Date   Hypertension    Seizures (HCC)    History reviewed. No pertinent surgical history.  Family Psychiatric History: Father alcoholic  Family History:  Family History  Problem Relation Age of  Onset   Diabetes Maternal Grandfather    Alzheimer's disease Maternal Grandfather    Parkinson's disease Maternal Grandfather    Neuropathy Maternal Grandfather    High Cholesterol Maternal Grandfather    Diabetes Paternal Grandfather    Cancer Paternal Grandfather     Social History:  Social History   Socioeconomic History   Marital status: Single    Spouse name: Not on file   Number of children: 2   Years of education: Not on file   Highest  education level: Some college, no degree  Occupational History   Occupation: food Press photographer  Tobacco Use   Smoking status: Every Day    Current packs/day: 1.00    Types: Cigarettes   Smokeless tobacco: Never  Vaping Use   Vaping status: Never Used  Substance and Sexual Activity   Alcohol use: Yes    Comment: daily   Drug use: Yes    Types: Marijuana   Sexual activity: Yes    Partners: Female    Birth control/protection: Condom  Other Topics Concern   Not on file  Social History Narrative   Not on file   Social Drivers of Health   Financial Resource Strain: Not on file  Food Insecurity: Not on file  Transportation Needs: Not on file  Physical Activity: Not on file  Stress: Not on file  Social Connections: Not on file    Allergies: No Known Allergies  Metabolic Disorder Labs: No results found for: "HGBA1C", "MPG" No results found for: "PROLACTIN" No results found for: "CHOL", "TRIG", "HDL", "CHOLHDL", "VLDL", "LDLCALC" No results found for: "TSH"  Therapeutic Level Labs: No results found for: "LITHIUM" No results found for: "VALPROATE" No results found for: "CBMZ"  Current Medications: Current Outpatient Medications  Medication Sig Dispense Refill   acetaminophen  (TYLENOL ) 500 MG tablet Take by mouth as needed.     gabapentin  (NEURONTIN ) 300 MG capsule Take 1 capsule (300 mg total) by mouth 2 (two) times daily. 60 capsule 0   hydrOXYzine  (ATARAX ) 25 MG tablet Take 1 tablet (25 mg total) by mouth 3 (three) times daily as needed. 90 tablet 0   losartan (COZAAR) 50 MG tablet Take 50 mg by mouth daily.     montelukast (SINGULAIR) 10 MG tablet Take 10 mg by mouth daily.     pantoprazole (PROTONIX) 40 MG tablet Take 40 mg by mouth 2 (two) times daily.     venlafaxine  XR (EFFEXOR -XR) 37.5 MG 24 hr capsule Take 1 capsule (37.5 mg total) by mouth daily with breakfast. 30 capsule 0   VENTOLIN  HFA 108 (90 Base) MCG/ACT inhaler as directed.     No current facility-administered  medications for this visit.     Musculoskeletal: Strength & Muscle Tone: within normal limits Gait & Station: normal Patient leans: N/A  Psychiatric Specialty Exam: Review of Systems  Constitutional: Negative.   HENT: Negative.    Eyes: Negative.   Respiratory: Negative.    Cardiovascular: Negative.   Gastrointestinal: Negative.   Endocrine: Negative.   Genitourinary: Negative.   Musculoskeletal: Negative.   Skin: Negative.   Allergic/Immunologic: Negative.   Neurological: Negative.   Hematological: Negative.   Psychiatric/Behavioral:  The patient is nervous/anxious.     Blood pressure (!) 141/93, pulse (!) 105, temperature 97.7 F (36.5 C), temperature source Temporal, height 6' (1.829 m), weight 259 lb 9.6 oz (117.8 kg).Body mass index is 35.21 kg/m.  General Appearance: Well Groomed  Eye Contact:  Good  Speech:  Clear and Coherent  Volume:  Normal  Mood:  Euthymic  Affect:  Appropriate  Thought Process:  Coherent  Orientation:  Full (Time, Place, and Person)  Thought Content: Logical   Suicidal Thoughts:  No  Homicidal Thoughts:  No  Memory:  Immediate;   Good Recent;   Good Remote;   Good  Judgement:  Good  Insight:  Good  Psychomotor Activity:  Normal  Concentration:  Concentration: Good and Attention Span: Good  Recall:  Good  Fund of Knowledge: Good  Language: Good  Akathisia:  No  Handed:  Right  AIMS (if indicated):   Assets:  Desire for Improvement Financial Resources/Insurance Housing Vocational/Educational  ADL's:  Intact  Cognition: WNL  Sleep:  Good   Screenings: PHQ2-9    Flowsheet Row Office Visit from 08/25/20[facility]  PHQ-2 Total Score 4  PHQ-9 Total Score 15      Flowsheet Row Office Visit from 08/25/2023 in [facility]  C-SSRS RISK CATEGORY No Risk        Assessment and Plan:  Assessment - Diagnosis: Generalized anxiety disorder  [F41.1]  2.  Major depressive disorder, recurrent episode, moderate (HCC) [F33.1]  3. Substance use disorder [F19.90]  Differential Diagnosis: Mood Disorder  - Progress: Patient reports improvement in his depression stating that he stopped Effexor  due to side effects and stated that he seems to have realize he is doing fine on gabapentin  and hydroxyzine  by itself.  Patient did not bring up any other medications stating that he feels that he is and try to do with less in which this provider that is supportive.  Patient has been educated to reach out to the clinic should he have any symptoms prior to the next appointment as, follow up in 3 months - Risk Factors: Suicide risk, worsening symptoms  Plan - Medications:  Stop Effexor . Continue gabapentin  300 mg twice a day for anxiety prescribed by his PCP.  Continue taking hydroxyzine  25 mg 3 times a day as needed for anxiety.  Patient educated on sedation of medication and advised to not drive while taking medication. - Psychotherapy: Not recommended at this time. - Education: Patient has been educated on the substance use stating that the use of marijuana daily as well as alcohol use daily with the positive CAGE assessment is recommended to stop use while on therapy due to psychoactive nature of substances.  Patient has also been educated on the clinic policy on benzos and has been told that he we will not represcribe Klonopin and for the patient to stop using the medication.  Patient is in agreement and states that he would like to cut down and understands that he should be participating in therapy and has been educated on IOP for substance use, and instructed to let provider know if he is interested in the program. - Follow-Up: Patient will follow-up in 3 months - Referrals: No referrals - Safety Planning:  The patient has been educated, if they should have suicidal thoughts with or without a plan to call 911, or go to the closest emergency  department.  Pt verbalized understanding.  Pt endorses having a firearm at home stating that it is locked and keeps the ammunition separate and secure in 2 different locations..  Pt also agrees to call the clinic should they have worsening symptoms before the next appointment.   Patient/Guardian was advised Release of Information must be obtained prior to any record release in order to collaborate their  care with an outside provider. Patient/Guardian was advised if they have not already done so to contact the registration department to sign all necessary forms in order for us  to release information regarding their care.   Consent: Patient/Guardian gives verbal consent for treatment and assignment of benefits for services provided during this visit. Patient/Guardian expressed understanding and agreed to proceed.   This office note has been dictated. This dictation was prepared using Air traffic controller. As a result, errors may occur. When identified, these errors have been corrected. While every attempt is made to correct errors during dictation, errors may still exist.  Arlana Labor, NP 09/28/2023, 2:39 PM

## 2023-12-13 ENCOUNTER — Encounter: Payer: Self-pay | Admitting: Emergency Medicine

## 2023-12-13 ENCOUNTER — Other Ambulatory Visit: Payer: Self-pay

## 2023-12-13 ENCOUNTER — Emergency Department
Admission: EM | Admit: 2023-12-13 | Discharge: 2023-12-13 | Disposition: A | Discharge status: Home / Self Care | Attending: Emergency Medicine | Admitting: Emergency Medicine

## 2023-12-13 ENCOUNTER — Emergency Department

## 2023-12-13 DIAGNOSIS — R7989 Other specified abnormal findings of blood chemistry: Secondary | ICD-10-CM | POA: Diagnosis not present

## 2023-12-13 DIAGNOSIS — I10 Essential (primary) hypertension: Secondary | ICD-10-CM | POA: Diagnosis not present

## 2023-12-13 DIAGNOSIS — F17200 Nicotine dependence, unspecified, uncomplicated: Secondary | ICD-10-CM | POA: Insufficient documentation

## 2023-12-13 DIAGNOSIS — R0789 Other chest pain: Secondary | ICD-10-CM | POA: Diagnosis present

## 2023-12-13 DIAGNOSIS — J40 Bronchitis, not specified as acute or chronic: Secondary | ICD-10-CM

## 2023-12-13 DIAGNOSIS — R079 Chest pain, unspecified: Secondary | ICD-10-CM

## 2023-12-13 DIAGNOSIS — J45909 Unspecified asthma, uncomplicated: Secondary | ICD-10-CM | POA: Insufficient documentation

## 2023-12-13 LAB — CBC
HCT: 54.1 % — ABNORMAL HIGH (ref 39.0–52.0)
Hemoglobin: 18.1 g/dL — ABNORMAL HIGH (ref 13.0–17.0)
MCH: 31 pg (ref 26.0–34.0)
MCHC: 33.5 g/dL (ref 30.0–36.0)
MCV: 92.8 fL (ref 80.0–100.0)
Platelets: 265 K/uL (ref 150–400)
RBC: 5.83 MIL/uL — ABNORMAL HIGH (ref 4.22–5.81)
RDW: 12.5 % (ref 11.5–15.5)
WBC: 10.5 K/uL (ref 4.0–10.5)
nRBC: 0 % (ref 0.0–0.2)

## 2023-12-13 LAB — BASIC METABOLIC PANEL WITH GFR
Anion gap: 12 (ref 5–15)
BUN: 10 mg/dL (ref 6–20)
CO2: 29 mmol/L (ref 22–32)
Calcium: 9.8 mg/dL (ref 8.9–10.3)
Chloride: 98 mmol/L (ref 98–111)
Creatinine, Ser: 0.96 mg/dL (ref 0.61–1.24)
GFR, Estimated: >60 mL/min (ref >60)
Glucose, Bld: 122 mg/dL — ABNORMAL HIGH (ref 70–99)
Potassium: 3.7 mmol/L (ref 3.5–5.1)
Sodium: 139 mmol/L (ref 135–145)

## 2023-12-13 LAB — TROPONIN I (HIGH SENSITIVITY): Troponin I (High Sensitivity): 5 ng/L (ref <18)

## 2023-12-13 MED ORDER — PREDNISONE 20 MG PO TABS
60.0000 mg | ORAL_TABLET | Freq: Once | ORAL | Status: AC
  Administered 2023-12-13: 60 mg via ORAL
  Filled 2023-12-13: qty 3

## 2023-12-13 MED ORDER — ALBUTEROL SULFATE HFA 108 (90 BASE) MCG/ACT IN AERS: 2.0000 | INHALATION_SPRAY | Freq: Four times a day (QID) | RESPIRATORY_TRACT | 2 refills | Status: AC | PRN

## 2023-12-13 MED ORDER — IPRATROPIUM-ALBUTEROL 0.5-2.5 (3) MG/3ML IN SOLN
3.0000 mL | Freq: Once | RESPIRATORY_TRACT | Status: AC
  Administered 2023-12-13: 3 mL via RESPIRATORY_TRACT
  Filled 2023-12-13: qty 3

## 2023-12-13 MED ORDER — PREDNISONE 10 MG PO TABS: 10.0000 mg | ORAL_TABLET | Freq: Every day | ORAL | 0 refills | Status: AC

## 2023-12-13 NOTE — ED Notes (Signed)
 See triage note  Presents with some chest discomfort  Feels like he is SOB  This started a couple of days ago

## 2023-12-13 NOTE — ED Triage Notes (Signed)
 Pt here for CP in central area which started a couple days ago. Associated with SOB. Occasion lightheadedness. No blood thinners

## 2023-12-13 NOTE — ED Provider Notes (Signed)
 [facility] Provider Note    Event Date/Time   First MD Initiated Contact with Patient 12/13/23 1501     (approximate)  History   Chief Complaint: Chest Pain  HPI  Garrett Adams is a 33 y.o. male with a past medical history of hypertension, anxiety, presents to the emergency department for chest discomfort.  According to the patient over the last 3 to 4 days he has been experiencing a chest discomfort/tightness sensation as well as a feeling that he cannot take a deep breath.  Patient states he is a smoker and has been feeling somewhat short of breath but denies any increased cough or sputum production.  No fever.  Patient states he was prescribed inhalers a long time ago but no longer uses them.  Physical Exam   Triage Vital Signs: ED Triage Vitals  Encounter Vitals Group     BP 12/13/23 1218 (!) 121/90     Girls Systolic BP Percentile --      Girls Diastolic BP Percentile --      Boys Systolic BP Percentile --      Boys Diastolic BP Percentile --      Pulse Rate 12/13/23 1218 99     Resp 12/13/23 1218 18     Temp 12/13/23 1218 98.3 F (36.8 C)     Temp Source 12/13/23 1218 Oral     SpO2 12/13/23 1218 96 %     Weight 12/13/23 1218 240 lb (108.9 kg)     Height 12/13/23 1218 6' (1.829 m)     Head Circumference --      Peak Flow --      Pain Score 12/13/23 1221 9     Pain Loc --      Pain Education --      Exclude from Growth Chart --     Most recent vital signs: Vitals:   12/13/23 1218  BP: (!) 121/90  Pulse: 99  Resp: 18  Temp: 98.3 F (36.8 C)  SpO2: 96%    General: Awake, no distress.  CV:  Good peripheral perfusion.  Regular rate and rhythm  Resp:  Normal effort.  Equal breath sounds bilaterally but mild expiratory wheeze throughout all lung fields. Abd:  No distention.  Soft, nontender.  No rebound or guarding.  ED Results / Procedures / Treatments   EKG  EKG viewed and interpreted by myself shows a normal sinus rhythm at  98 bpm with a narrow QRS, normal axis, normal intervals, no concerning ST changes.  RADIOLOGY  I have reviewed and interpreted the x-ray images.  No obvious consolidation on my evaluation. Radiology is read increased right middle lobe opacity likely atelectasis.   MEDICATIONS ORDERED IN ED: Medications  predniSONE  (DELTASONE ) tablet 60 mg (60 mg Oral Given 12/13/23 1516)  ipratropium-albuterol  (DUONEB) 0.5-2.5 (3) MG/3ML nebulizer solution 3 mL (3 mLs Nebulization Given 12/13/23 1517)  ipratropium-albuterol  (DUONEB) 0.5-2.5 (3) MG/3ML nebulizer solution 3 mL (3 mLs Nebulization Given 12/13/23 1517)     IMPRESSION / MDM / ASSESSMENT AND PLAN / ED COURSE  I reviewed the triage vital signs and the nursing notes.  Patient's presentation is most consistent with acute presentation with potential threat to life or bodily function.  Patient presents the emergency department for increased shortness of breath over the last 3 to 4 days.  Patient has diffuse wheeze throughout all lung fields on examination.  He is a daily smoker.  Patient's chest x-ray overall appears clear they are  reading is likely has atelectasis.  Denies any cough congestion or fever to suggest an infectious etiology.  However he does have diffuse wheeze he is a daily smoker we will treat with DuoNebs and start the patient on prednisone .  Suspect there is a degree of reactive airway disease and possibly bronchitis.  Patient CBC is overall reassuring mild elevation in the patient's hemoglobin but this is largely unchanged from historical values.  Discussed PCP follow-up and plenty of hydration.  Patient's chemistry is reassuring.  And troponin is reassuringly negative.  We will treat in the emergency department with DuoNebs and prednisone .  As long as the patient improves I believe the patient could be safely discharged home with a prednisone  taper and albuterol  inhaler to be used as needed.  Patient agreeable to plan as well.  Patient is  feeling much better after breathing treatments.  I have reexamined his lungs significant decrease in the amount of wheeze I have very slight end expiratory wheeze only now.  We will discharge with albuterol  as well as a prednisone  taper have the patient follow-up with his doctor.  Patient agreeable to plan of care.  Provided my typical chest pain return precautions.  FINAL CLINICAL IMPRESSION(S) / ED DIAGNOSES   Reactive airway disease Bronchitis   Note:  This document was prepared using Dragon voice recognition software and may include unintentional dictation errors.   Dorothyann Drivers, MD 12/13/23 1553

## 2023-12-14 ENCOUNTER — Telehealth: Admitting: Physician Assistant

## 2023-12-14 DIAGNOSIS — R9431 Abnormal electrocardiogram [ECG] [EKG]: Secondary | ICD-10-CM

## 2023-12-14 MED ORDER — MELOXICAM 7.5 MG PO TABS: 7.5000 mg | ORAL_TABLET | Freq: Every day | ORAL | 0 refills | Status: AC

## 2023-12-14 NOTE — Progress Notes (Signed)
 [name], Garrett Adams are scheduled for a virtual visit with your provider today.    Just as we do with appointments in the office, we must obtain your consent to participate.  Your consent will be active for this visit and any virtual visit you may have with one of our providers in the next 365 days.    If you have a MyChart account, I can also send a copy of this consent to you electronically.  All virtual visits are billed to your insurance company just like a traditional visit in the office.  As this is a virtual visit, video technology does not allow for your provider to perform a traditional examination.  This may limit your provider's ability to fully assess your condition.  If your provider identifies any concerns that need to be evaluated in person or the need to arrange testing such as labs, EKG, etc, we will make arrangements to do so.    Although advances in technology are sophisticated, we cannot ensure that it will always work on either your end or our end.  If the connection with a video visit is poor, we may have to switch to a telephone visit.  With either a video or telephone visit, we are not always able to ensure that we have a secure connection.   I need to obtain your verbal consent now.   Are you willing to proceed with your visit today?   Garrett Adams has provided verbal consent on 12/14/2023 for a virtual visit (video or telephone).   Garrett Adams, [place] 12/14/2023  5:15 PM   Date:  12/14/2023   ID:  Garrett Adams, Garrett Adams 04-Oct-1990, MRN [id number]  Patient Location: Home Provider Location: Home Office   Participants: Patient and Provider for Visit and Wrap up  Method of visit: Video  Location of Patient: Home Location of Provider: Home Office Consent was obtain for visit over the video. Services rendered by provider: Visit was performed via video  A video enabled telemedicine application was used and I verified that I am speaking with the correct person using  two identifiers.  PCP:  Patient, No Pcp Per   Chief Complaint:  EKG results  History of Present Illness:    Garrett Adams is a 33 Adams.o. male with history as stated below. Presents video telehealth for an acute care visit  Pt states that he was seen in the ED yesterday for chest pain and shortness of breath. He states he had an EKG completed and he got a notification that it was abnormal. He was wanting clarification about this. He is also asking for medicine for pain, he feels like he pulled a muscle coughing.   Past Medical, Surgical, Social History, Allergies, and Medications have been Reviewed.  Past Medical History:  Diagnosis Date   Hypertension    Seizures (HCC)     No outpatient medications have been marked as taking for the 12/14/23 encounter (Appointment) with [facility] PROVIDER.     Allergies:   Patient has no known allergies.   ROS See HPI for history of present illness.  Physical Exam Constitutional:      Appearance: Normal appearance.  Pulmonary:     Effort: Pulmonary effort is normal.     Comments: Speaking in full sentences, no respiratory distress Neurological:     Mental Status: He is alert.      MDM: Pt requesting clarification on EKG results and medicine for pain as he pulled a muscle. Discussed EKG  results and rx was sent for meloxicam    Tests Ordered: No orders of the defined types were placed in this encounter.   Medication Changes: No orders of the defined types were placed in this encounter.    Disposition:  Follow up  Signed, Garrett GORMAN Snuffer, PA-C  12/14/2023 5:15 PM

## 2023-12-14 NOTE — Patient Instructions (Signed)
  Garrett Adams, thank you for joining Garrett GORMAN Snuffer, PA-C for today's virtual visit.  While this provider is not your primary care provider (PCP), if your PCP is located in our provider database this encounter information will be shared with them immediately following your visit.   A [facility] MyChart account gives you access to today's visit and all your visits, tests, and labs performed at [facility]  click here if you don't have a [facility] MyChart account or go to [email or web address]  Consent: (Patient) Garrett Adams provided verbal consent for this virtual visit at the beginning of the encounter.  Current Medications:  Current Outpatient Medications:    acetaminophen  (TYLENOL ) 500 MG tablet, Take by mouth as needed., Disp: , Rfl:    albuterol  (VENTOLIN  HFA) 108 (90 Base) MCG/ACT inhaler, Inhale 2 puffs into the lungs every 6 (six) hours as needed for wheezing or shortness of breath., Disp: 8 g, Rfl: 2   gabapentin  (NEURONTIN ) 300 MG capsule, Take 1 capsule (300 mg total) by mouth 2 (two) times daily., Disp: 60 capsule, Rfl: 2   hydrOXYzine  (ATARAX ) 25 MG tablet, Take 1 tablet (25 mg total) by mouth 3 (three) times daily as needed., Disp: 90 tablet, Rfl: 2   losartan (COZAAR) 50 MG tablet, Take 50 mg by mouth daily., Disp: , Rfl:    montelukast (SINGULAIR) 10 MG tablet, Take 10 mg by mouth daily., Disp: , Rfl:    pantoprazole (PROTONIX) 40 MG tablet, Take 40 mg by mouth 2 (two) times daily., Disp: , Rfl:    predniSONE  (DELTASONE ) 10 MG tablet, Take 1 tablet (10 mg total) by mouth daily. Day 1-3: take 4 tablets PO daily Day 4-6: take 3 tablets PO daily Day 7-9: take 2 tablets PO daily Day 10-12: take 1 tablet PO daily, Disp: 30 tablet, Rfl: 0   venlafaxine  XR (EFFEXOR -XR) 37.5 MG 24 hr capsule, Take 1 capsule (37.5 mg total) by mouth daily with breakfast., Disp: 30 capsule, Rfl: 0   Medications ordered in this encounter:  No orders of the defined types  were placed in this encounter.    *If you need refills on other medications prior to your next appointment, please contact your pharmacy*  Follow-Up: Call back or seek an in-person evaluation if the symptoms worsen or if the condition fails to improve as anticipated.  [facility] [phone]  Other Instructions Take meloxicam  as directed   Follow up with your regular doctor in 1 week for reassessment and seek care sooner if your symptoms worsen or fail to improve.    If you have been instructed to have an in-person evaluation today at a local Urgent Care facility, please use the link below. It will take you to a list of all of our available [facility], including address, phone number and hours of operation. Please do not delay care.  [facility]  If you or a family member do not have a primary care provider, use the link below to schedule a visit and establish care. When you choose a [facility] primary care physician or advanced practice provider, you gain a long-term partner in health. Find a Primary Care Provider  Learn more about [facility] in-office and virtual care options: [place] - Get Care Now

## 2023-12-28 ENCOUNTER — Telehealth: Admitting: Psychiatry

## 2023-12-28 DIAGNOSIS — F3342 Major depressive disorder, recurrent, in full remission: Secondary | ICD-10-CM | POA: Diagnosis not present

## 2023-12-28 DIAGNOSIS — F411 Generalized anxiety disorder: Secondary | ICD-10-CM | POA: Diagnosis not present

## 2023-12-28 MED ORDER — GABAPENTIN 300 MG PO CAPS: 300.0000 mg | ORAL_CAPSULE | Freq: Three times a day (TID) | ORAL | 2 refills | Status: DC

## 2023-12-28 NOTE — Progress Notes (Signed)
 BH MD/PA/NP OP Progress Note  12/28/2023 4:04 PM Garrett Adams  MRN:  [id number]  Chief Complaint: Routine follow-up  Virtual Visit via Video Note  I connected with Garrett Adams on 12/28/23 at  4:00 PM EDT by a video enabled telemedicine application and verified that I am speaking with the correct person using two identifiers.  Location: Patient: [address]  [place] [address]  Provider: ARPA Office   I discussed the limitations of evaluation and management by telemedicine and the availability of in person appointments. The patient expressed understanding and agreed to proceed.    I discussed the assessment and treatment plan with the patient. The patient was provided an opportunity to ask questions and all were answered. The patient agreed with the plan and demonstrated an understanding of the instructions.   The patient was advised to call back or seek an in-person evaluation if the symptoms worsen or if the condition fails to improve as anticipated.  I provided 30 minutes of non-face-to-face time during this encounter.   Dorn Jama Der, NP    HPI: 33 year old male presenting ARPA for follow-up.  Patient reports that he has been doing well stating that he has been getting more involved in community his family and as well as his work.  Patient reports that the [place] has been preventing him from working a lot but states that he is all normal happy.  Patient reports that his anxiety has been better controlled but states that he has been taking gabapentin  at 300 mg at 1 PM in addition to his twice daily to help him manage his anxiety and feels that this has been helping him with his symptoms.  Patient asked if he can increase the dose in which this is appropriate stating that the patient can continue taking gabapentin  300 mg 3 times a day for anxiety.  Patient reports that he has been doing well and states that there is no psychosocial stressors at this time  and that he is happy that his anxiety is being better managed.  Patient denies SI, HI, AVH.  Based on this assessment interview is recommended for the patient to increase gabapentin  to 300 mg 3 times a day for anxiety.  Patient with no other questions or concerns at this time.  Patient is in agreement with treatment plan.  Patient will follow up in 3 months virtual. Visit Diagnosis:    ICD-10-CM   1. Generalized anxiety disorder  F41.1 gabapentin  (NEURONTIN ) 300 MG capsule    2. Recurrent major depressive disorder, in full remission (HCC)  F33.42       Past Psychiatric History:  Previous Psych Hospitalizations: -2019, admitted as rehab for detox off Suboxone.  Patient was significant history of substance use including heroin and LSD 4 years ago ecstasy 5 years ago Suboxone this past year as well as use of cocaine 1 year ago and cannabis daily and alcohol daily 6 cans a day.   Outpatient treatment: Currently diagnosed with anxiety and depression.   Medications Current: -Gabapentin  300 mg TID for anxiety    Next Steps:  - Currently on maintenance, pt next appointment in 3 months.    Medication Trials: -09/28/23 Effexor , pt reports numbing, felt like a zombie.  -2019 Zoloft, poor response was taking with substance use -2019 citalopram, poor response with substance use -2019 Prozac, poor response with substance use -2019 buspirone, poor response with substance use -2019 Remeron, poor response with substance use   Suicide & Violence:  Denies SI, HI, AVH.  Patient also denies previous suicide attempts.   Psychotherapy: Patient currently recommended for psychotherapy with Almarie at [facility] patient placed on waiting list.   Legal: Denies current any legal issues.  Past Medical History:  Past Medical History:  Diagnosis Date   Hypertension    Seizures (HCC)    No past surgical history on file.  Family Psychiatric History: No additional  Family History:  Family History  Problem  Relation Age of Onset   Diabetes Maternal Grandfather    Alzheimer's disease Maternal Grandfather    Parkinson's disease Maternal Grandfather    Neuropathy Maternal Grandfather    High Cholesterol Maternal Grandfather    Diabetes Paternal Grandfather    Cancer Paternal Grandfather     Social History:  Social History   Socioeconomic History   Marital status: Single    Spouse name: Not on file   Number of children: 2   Years of education: Not on file   Highest education level: Some college, no degree  Occupational History   Occupation: food Press photographer  Tobacco Use   Smoking status: Every Day    Current packs/day: 1.00    Types: Cigarettes   Smokeless tobacco: Never  Vaping Use   Vaping status: Never Used  Substance and Sexual Activity   Alcohol use: Yes    Comment: daily   Drug use: Yes    Types: Marijuana   Sexual activity: Yes    Partners: Female    Birth control/protection: Condom  Other Topics Concern   Not on file  Social History Narrative   Not on file   Social Drivers of Health   Financial Resource Strain: Not on file  Food Insecurity: Not on file  Transportation Needs: Not on file  Physical Activity: Not on file  Stress: Not on file  Social Connections: Not on file    Allergies: No Known Allergies  Metabolic Disorder Labs: No results found for: HGBA1C, MPG No results found for: PROLACTIN No results found for: CHOL, TRIG, HDL, CHOLHDL, VLDL, LDLCALC No results found for: TSH  Therapeutic Level Labs: No results found for: LITHIUM No results found for: VALPROATE No results found for: CBMZ  Current Medications: Current Outpatient Medications  Medication Sig Dispense Refill   acetaminophen  (TYLENOL ) 500 MG tablet Take by mouth as needed.     albuterol  (VENTOLIN  HFA) 108 (90 Base) MCG/ACT inhaler Inhale 2 puffs into the lungs every 6 (six) hours as needed for wheezing or shortness of breath. 8 g 2   gabapentin  (NEURONTIN ) 300 MG  capsule Take 1 capsule (300 mg total) by mouth 2 (two) times daily. 60 capsule 2   hydrOXYzine  (ATARAX ) 25 MG tablet Take 1 tablet (25 mg total) by mouth 3 (three) times daily as needed. 90 tablet 2   losartan (COZAAR) 50 MG tablet Take 50 mg by mouth daily.     montelukast (SINGULAIR) 10 MG tablet Take 10 mg by mouth daily.     pantoprazole (PROTONIX) 40 MG tablet Take 40 mg by mouth 2 (two) times daily.     predniSONE  (DELTASONE ) 10 MG tablet Take 1 tablet (10 mg total) by mouth daily. Day 1-3: take 4 tablets PO daily Day 4-6: take 3 tablets PO daily Day 7-9: take 2 tablets PO daily Day 10-12: take 1 tablet PO daily 30 tablet 0   venlafaxine  XR (EFFEXOR -XR) 37.5 MG 24 hr capsule Take 1 capsule (37.5 mg total) by mouth daily with breakfast. 30 capsule 0  No current facility-administered medications for this visit.     Musculoskeletal: Strength & Muscle Tone: within normal limits Gait & Station: normal Patient leans: N/A  Psychiatric Specialty Exam: Review of Systems  Constitutional: Negative.   HENT: Negative.    Eyes: Negative.   Respiratory: Negative.    Cardiovascular: Negative.   Gastrointestinal: Negative.   Endocrine: Negative.   Genitourinary: Negative.   Musculoskeletal: Negative.   Skin: Negative.   Allergic/Immunologic: Negative.   Neurological: Negative.   Hematological: Negative.   Psychiatric/Behavioral:  Positive for dysphoric mood. The patient is nervous/anxious.     There were no vitals taken for this visit.There is no height or weight on file to calculate BMI.  General Appearance: Well Groomed  Eye Contact:  Good  Speech:  Clear and Coherent  Volume:  Normal  Mood:  Euthymic  Affect:  Appropriate  Thought Process:  Coherent  Orientation:  Full (Time, Place, and Person)  Thought Content: Logical   Suicidal Thoughts:  No  Homicidal Thoughts:  No  Memory:  Immediate;   Good Recent;   Good Remote;   Good  Judgement:  Good  Insight:  Good  Psychomotor  Activity:  Normal  Concentration:  Concentration: Good and Attention Span: Good  Recall:  Good  Fund of Knowledge: Good  Language: Good  Akathisia:  No  Handed:  Right  AIMS (if indicated):   Assets:  Desire for Improvement Financial Resources/Insurance Housing  ADL's:  Intact  Cognition: WNL  Sleep:  Good   Screenings: PHQ2-9    Flowsheet Row Office Visit from 09/28/2023 in [facility] Office Visit from 08/25/2023 in [facility]  PHQ-2 Total Score 0 4  PHQ-9 Total Score -- 15   Flowsheet Row ED from 12/13/2023 in [facility] Visit from 09/28/2023 in [facility] Office Visit from 08/25/2023 in [facility]  C-SSRS RISK CATEGORY No Risk No Risk No Risk     Assessment and Plan:  Assessment - Diagnosis: Generalized anxiety disorder [F41.1]  2. Recurrent major depressive disorder, in full remission (HCC) [F33.42]  3. Substance use disorder [F19.90]  Differential Diagnosis: Mood Disorder  - Progress: Patient reports improvement in his depression stating that he stopped Effexor  due to side effects and stated that he seems to have realize he is doing fine on gabapentin  and hydroxyzine  by itself.  Patient did not bring up any other medications stating that he feels that he is and try to do with less in which this provider that is supportive.  Patient has been educated to reach out to the clinic should he have any symptoms prior to the next appointment as, follow up in 3 months - Risk Factors: Suicide risk, worsening symptoms  Plan - Medications:  Continue gabapentin  300 mg TID for anxiety.  Stop Hydroxyzine . - Psychotherapy: Not recommended at this time. - Education: Patient has been educated on the substance use stating that the use of marijuana daily as well as alcohol use  daily with the positive CAGE assessment is recommended to stop use while on therapy due to psychoactive nature of substances.  Patient has also been educated on the clinic policy on benzos and has been told that he we will not represcribe Klonopin and for the patient to stop using the medication.  Patient is in agreement and states that he would like to cut down and understands that he  should be participating in therapy and has been educated on IOP for substance use, and instructed to let provider know if he is interested in the program. - Follow-Up: Patient will follow-up in 3 months - Referrals: No referrals - Safety Planning:  The patient has been educated, if they should have suicidal thoughts with or without a plan to call 911, or go to the closest emergency department.  Pt verbalized understanding.  Pt endorses having a firearm at home stating that it is locked and keeps the ammunition separate and secure in 2 different locations..  Pt also agrees to call the clinic should they have wors   Patient/Guardian was advised Release of Information must be obtained prior to any record release in order to collaborate their care with an outside provider. Patient/Guardian was advised if they have not already done so to contact the registration department to sign all necessary forms in order for us  to release information regarding their care.   Consent: Patient/Guardian gives verbal consent for treatment and assignment of benefits for services provided during this visit. Patient/Guardian expressed understanding and agreed to proceed.    Dorn Jama Der, NP 12/28/2023, 4:04 PM

## 2024-03-19 ENCOUNTER — Telehealth: Payer: Self-pay

## 2024-03-19 NOTE — Telephone Encounter (Signed)
 Received fax from patients pharmacy requesting a refill of hydrOXYzine  (ATARAX ) 25 MG tablet   Last visit 12-28-23 Next visit 03-29-24    Preferred Pharmacies   [id number] - [place], [place] - [address] AT [facility] Phone: [phone]  Fax: [phone]

## 2024-03-20 ENCOUNTER — Other Ambulatory Visit: Payer: Self-pay | Admitting: Psychiatry

## 2024-03-20 DIAGNOSIS — F411 Generalized anxiety disorder: Secondary | ICD-10-CM

## 2024-03-20 MED ORDER — HYDROXYZINE HCL 25 MG PO TABS: 25.0000 mg | ORAL_TABLET | Freq: Three times a day (TID) | ORAL | 2 refills | Status: AC | PRN

## 2024-03-29 ENCOUNTER — Telehealth (INDEPENDENT_AMBULATORY_CARE_PROVIDER_SITE_OTHER): Admitting: Psychiatry

## 2024-03-29 DIAGNOSIS — F411 Generalized anxiety disorder: Secondary | ICD-10-CM

## 2024-03-29 DIAGNOSIS — F3342 Major depressive disorder, recurrent, in full remission: Secondary | ICD-10-CM | POA: Diagnosis not present

## 2024-03-29 MED ORDER — FLUOXETINE HCL 10 MG PO CAPS: 10.0000 mg | ORAL_CAPSULE | Freq: Every day | ORAL | 0 refills | Status: DC

## 2024-03-29 NOTE — Progress Notes (Unsigned)
 BH MD/PA/NP OP Progress Note  03/29/2024 1:59 PM Garrett Adams  MRN:  [id number]  Chief Complaint: Routine follow-up Virtual Visit via Video Note  I connected with Garrett Adams on 03/29/24 at  2:00 PM EST by a video enabled telemedicine application and verified that I am speaking with the correct person using two identifiers.  Location: Patient: [address]  [place] [address]  Provider: [facility] Office of Provider   I discussed the limitations of evaluation and management by telemedicine and the availability of in person appointments. The patient expressed understanding and agreed to proceed.    I discussed the assessment and treatment plan with the patient. The patient was provided an opportunity to ask questions and all were answered. The patient agreed with the plan and demonstrated an understanding of the instructions.   The patient was advised to call back or seek an in-person evaluation if the symptoms worsen or if the condition fails to improve as anticipated.  I provided 30 minutes of non-face-to-face time during this encounter.   Dorn Jama Der, NP   HPI: 33 year old male presenting ARPA for follow-up.  Patient reports that he is having some issues in regards to sleep.  Patient states he is also having some issues with depression and anxiety since last meeting.  Patient reports that he has been having some issues and just trying to meet day-to-day expectations of the father as well as his previous relationship is coming back to give him some issues.  Patient states that he would like to consider starting a medication for depression in which she was recommended to start 10 mg of fluoxetine once daily.  Patient continues to take gabapentin  300 mg twice a day for anxiety.  Based on this assessment interview patient will continue the current medication regimen with the recommendations listed.  See plan.  Patient with no other question concerns at this  time.  Patient is in agreement treatment plan.  Patient denies SI, HI, AVH.  Patient will follow-up in 2 weeks. Visit Diagnosis:    ICD-10-CM   1. Generalized anxiety disorder  F41.1     2. Recurrent major depressive disorder, in full remission  F33.42       Past Psychiatric History:  Previous Psych Hospitalizations: -2019, admitted as rehab for detox off Suboxone.  Patient was significant history of substance use including heroin and LSD 4 years ago ecstasy 5 years ago Suboxone this past year as well as use of cocaine 1 year ago and cannabis daily and alcohol daily 6 cans a day.   Outpatient treatment: Currently diagnosed with anxiety and depression.   Medications Current: -Gabapentin  300 mg TID for anxiety     Next Steps:  - Currently on maintenance, pt next appointment in 3 months.    Medication Trials: -09/28/23 Effexor , pt reports numbing, felt like a zombie.  -2019 Zoloft, poor response was taking with substance use -2019 citalopram, poor response with substance use -2019 Prozac, poor response with substance use -2019 buspirone, poor response with substance use -2019 Remeron, poor response with substance use   Suicide & Violence: Denies SI, HI, AVH.  Patient also denies previous suicide attempts.   Psychotherapy: Patient currently recommended for psychotherapy with Almarie at [facility] patient placed on waiting list.   Legal: Denies current any legal issues.  Past Medical History:  Past Medical History:  Diagnosis Date   Hypertension    Seizures (HCC)    No past surgical history on file.  Family Psychiatric History: No additional  Family History:  Family History  Problem Relation Age of Onset   Diabetes Maternal Grandfather    Alzheimer's disease Maternal Grandfather    Parkinson's disease Maternal Grandfather    Neuropathy Maternal Grandfather    High Cholesterol Maternal Grandfather    Diabetes Paternal Grandfather    Cancer Paternal Grandfather      Social History:  Social History   Socioeconomic History   Marital status: Single    Spouse name: Not on file   Number of children: 2   Years of education: Not on file   Highest education level: Some college, no degree  Occupational History   Occupation: food press photographer  Tobacco Use   Smoking status: Every Day    Current packs/day: 1.00    Types: Cigarettes   Smokeless tobacco: Never  Vaping Use   Vaping status: Never Used  Substance and Sexual Activity   Alcohol use: Yes    Comment: daily   Drug use: Yes    Types: Marijuana   Sexual activity: Yes    Partners: Female    Birth control/protection: Condom  Other Topics Concern   Not on file  Social History Narrative   Not on file   Social Drivers of Health   Financial Resource Strain: Not on file  Food Insecurity: Not on file  Transportation Needs: Not on file  Physical Activity: Not on file  Stress: Not on file  Social Connections: Not on file    Allergies: No Known Allergies  Metabolic Disorder Labs: No results found for: HGBA1C, MPG No results found for: PROLACTIN No results found for: CHOL, TRIG, HDL, CHOLHDL, VLDL, LDLCALC No results found for: TSH  Therapeutic Level Labs: No results found for: LITHIUM No results found for: VALPROATE No results found for: CBMZ  Current Medications: Current Outpatient Medications  Medication Sig Dispense Refill   acetaminophen  (TYLENOL ) 500 MG tablet Take by mouth as needed.     albuterol  (VENTOLIN  HFA) 108 (90 Base) MCG/ACT inhaler Inhale 2 puffs into the lungs every 6 (six) hours as needed for wheezing or shortness of breath. 8 g 2   gabapentin  (NEURONTIN ) 300 MG capsule Take 1 capsule (300 mg total) by mouth 3 (three) times daily. 90 capsule 2   hydrOXYzine  (ATARAX ) 25 MG tablet Take 1 tablet (25 mg total) by mouth 3 (three) times daily as needed. 90 tablet 2   losartan (COZAAR) 50 MG tablet Take 50 mg by mouth daily.     montelukast  (SINGULAIR) 10 MG tablet Take 10 mg by mouth daily.     pantoprazole (PROTONIX) 40 MG tablet Take 40 mg by mouth 2 (two) times daily.     predniSONE  (DELTASONE ) 10 MG tablet Take 1 tablet (10 mg total) by mouth daily. Day 1-3: take 4 tablets PO daily Day 4-6: take 3 tablets PO daily Day 7-9: take 2 tablets PO daily Day 10-12: take 1 tablet PO daily 30 tablet 0   venlafaxine  XR (EFFEXOR -XR) 37.5 MG 24 hr capsule Take 1 capsule (37.5 mg total) by mouth daily with breakfast. 30 capsule 0   No current facility-administered medications for this visit.     Musculoskeletal: Strength & Muscle Tone: within normal limits Gait & Station: normal Patient leans: N/A   Psychiatric Specialty Exam: Review of Systems  Constitutional: Negative.   HENT: Negative.    Eyes: Negative.   Respiratory: Negative.    Cardiovascular: Negative.   Gastrointestinal: Negative.   Endocrine: Negative.  Genitourinary: Negative.   Musculoskeletal: Negative.   Skin: Negative.   Allergic/Immunologic: Negative.   Neurological: Negative.   Hematological: Negative.   Psychiatric/Behavioral:  Positive for dysphoric mood. The patient is nervous/anxious.     There were no vitals taken for this visit.There is no height or weight on file to calculate BMI.  General Appearance: Well Groomed  Eye Contact:  Good  Speech:  Clear and Coherent  Volume:  Normal  Mood:  Euthymic  Affect:  Appropriate  Thought Process:  Coherent  Orientation:  Full (Time, Place, and Person)  Thought Content: Logical   Suicidal Thoughts:  No  Homicidal Thoughts:  No  Memory:  Immediate;   Good Recent;   Good Remote;   Good  Judgement:  Good  Insight:  Good  Psychomotor Activity:  Normal  Concentration:  Concentration: Good and Attention Span: Good  Recall:  Good  Fund of Knowledge: Good  Language: Good  Akathisia:  No  Handed:  Right  AIMS (if indicated):   Assets:  Desire for Improvement Financial Resources/Insurance Housing   ADL's:  Intact  Cognition: WNL  Sleep:  Good   Screenings: PHQ2-9    Flowsheet Row Office Visit from 09/28/2023 in [facility] Office Visit from 08/25/2023 in [facility]  PHQ-2 Total Score 0 4  PHQ-9 Total Score -- 15   Flowsheet Row ED from 12/13/2023 in [facility] at [facility] Visit from 09/28/2023 in [facility] Office Visit from 08/25/2023 in [facility]  C-SSRS RISK CATEGORY No Risk No Risk No Risk     Assessment and Plan:  Assessment - Diagnosis: Generalized anxiety disorder [F41.1]  2. Recurrent major depressive disorder, in full remission (HCC) [F33.42]  3. Substance use disorder [F19.90]  Differential Diagnosis: Mood Disorder  - Progress: Patient reports improvement in his depression stating that he stopped Effexor  due to side effects and stated that he seems to have realize he is doing fine on gabapentin  and hydroxyzine  by itself.  Patient did not bring up any other medications stating that he feels that he is and try to do with less in which this provider that is supportive.  Patient has been educated to reach out to the clinic should he have any symptoms prior to the next appointment as, follow up in 3 months - Risk Factors: Suicide risk, worsening symptoms  Plan - Medications:  Continue gabapentin  300 mg TID for anxiety.  Start Fluoxetine 10mg  once daily for 2 weeks.  - Psychotherapy: Therapist  - Education: Patient has been educated on the substance use stating that the use of marijuana daily as well as alcohol use daily with the positive CAGE assessment is recommended to stop use while on therapy due to psychoactive nature of substances.  Patient has also been educated on the clinic policy on benzos and has been told that he we will not represcribe Klonopin and for the  patient to stop using the medication.  Patient is in agreement and states that he would like to cut down and understands that he should be participating in therapy and has been educated on IOP for substance use, and instructed to let provider know if he is interested in the program. - Follow-Up: Patient will follow-up in 3 months - Referrals: No referrals - Safety Planning:  The patient has been educated, if they should have suicidal thoughts with or without a plan to  call 911, or go to the closest emergency department.  Pt verbalized understanding.  Pt endorses having a firearm at home stating that it is locked and keeps the ammunition separate and secure in 2 different locations..  Pt also agrees to call the clinic should they have wors  Patient/Guardian was advised Release of Information must be obtained prior to any record release in order to collaborate their care with an outside provider. Patient/Guardian was advised if they have not already done so to contact the registration department to sign all necessary forms in order for us  to release information regarding their care.   Consent: Patient/Guardian gives verbal consent for treatment and assignment of benefits for services provided during this visit. Patient/Guardian expressed understanding and agreed to proceed.    Dorn Jama Der, NP 03/29/2024, 1:59 PM

## 2024-04-09 ENCOUNTER — Telehealth (INDEPENDENT_AMBULATORY_CARE_PROVIDER_SITE_OTHER): Admitting: Psychiatry

## 2024-04-09 DIAGNOSIS — F1990 Other psychoactive substance use, unspecified, uncomplicated: Secondary | ICD-10-CM | POA: Diagnosis not present

## 2024-04-09 DIAGNOSIS — F3342 Major depressive disorder, recurrent, in full remission: Secondary | ICD-10-CM | POA: Diagnosis not present

## 2024-04-09 DIAGNOSIS — F411 Generalized anxiety disorder: Secondary | ICD-10-CM

## 2024-04-09 DIAGNOSIS — F331 Major depressive disorder, recurrent, moderate: Secondary | ICD-10-CM

## 2024-04-09 NOTE — Progress Notes (Signed)
 BH MD/PA/NP OP Progress Note  04/09/2024 1:32 PM Garrett Adams  MRN:  [id number]  Chief Complaint: Routine Follow-up  Virtual Visit via Video Note  I connected with Garrett Adams on 04/09/24 at  1:30 PM EST by a video enabled telemedicine application and verified that I am speaking with the correct person using two identifiers.  Location: Patient: [address]  [place] [address]  Provider: [facility] Office of Provider   I discussed the limitations of evaluation and management by telemedicine and the availability of in person appointments. The patient expressed understanding and agreed to proceed.    I discussed the assessment and treatment plan with the patient. The patient was provided an opportunity to ask questions and all were answered. The patient agreed with the plan and demonstrated an understanding of the instructions.   The patient was advised to call back or seek an in-person evaluation if the symptoms worsen or if the condition fails to improve as anticipated.  I provided 30 minutes of non-face-to-face time during this encounter.   Dorn Jama Der, NP   HPI: 33 year old male presenting ARPA for follow-up.  Patient reports he has been doing well since starting on Prozac  at 10 mg once daily.  Patient reports that he is able to tolerate stress better and that things are not getting to them like they used to.  Patient also reports that he is having some vivid dreams but not nightmares states that we will monitor.  Patient states that the medication is going well and would like to consider taking 20 mg once daily.  Based on this assessment interview patient is recommended to increase fluoxetine  to 20 mg as he is at not having any side effects or any issues regarding the medication.  Patient with no other question concerns.  Patient is in agreement treatment plan.  Patient is going to continue the gabapentin  as prescribed.  Patient with no other changes.   Patient denies SI, HI, AVH.  Patient to follow-up in 2 weeks. Visit Diagnosis:    ICD-10-CM   1. Generalized anxiety disorder  F41.1     2. Major depressive disorder, recurrent episode, moderate (HCC)  F33.1       Past Psychiatric History:  Previous Psych Hospitalizations: -2019, admitted as rehab for detox off Suboxone.  Patient was significant history of substance use including heroin and LSD 4 years ago ecstasy 5 years ago Suboxone this past year as well as use of cocaine 1 year ago and cannabis daily and alcohol daily 6 cans a day.   Outpatient treatment: Currently diagnosed with anxiety and depression.   Medications Current: -Gabapentin  300 mg TID for anxiety     Next Steps:  - Currently on maintenance, pt next appointment in 3 months.    Medication Trials: -09/28/23 Effexor , pt reports numbing, felt like a zombie.  -2019 Zoloft, poor response was taking with substance use -2019 citalopram, poor response with substance use -2019 Prozac , poor response with substance use -2019 buspirone, poor response with substance use -2019 Remeron, poor response with substance use   Suicide & Violence: Denies SI, HI, AVH.  Patient also denies previous suicide attempts.   Psychotherapy: Patient currently recommended for psychotherapy with Almarie at [facility] patient placed on waiting list.   Legal: Denies current any legal issues.  Past Medical History:  Past Medical History:  Diagnosis Date   Hypertension    Seizures (HCC)    No past surgical history on file.  Family Psychiatric History: No Additional  Family History:  Family History  Problem Relation Age of Onset   Diabetes Maternal Grandfather    Alzheimer's disease Maternal Grandfather    Parkinson's disease Maternal Grandfather    Neuropathy Maternal Grandfather    High Cholesterol Maternal Grandfather    Diabetes Paternal Grandfather    Cancer Paternal Grandfather     Social History:  Social History    Socioeconomic History   Marital status: Single    Spouse name: Not on file   Number of children: 2   Years of education: Not on file   Highest education level: Some college, no degree  Occupational History   Occupation: food press photographer  Tobacco Use   Smoking status: Every Day    Current packs/day: 1.00    Types: Cigarettes   Smokeless tobacco: Never  Vaping Use   Vaping status: Never Used  Substance and Sexual Activity   Alcohol use: Yes    Comment: daily   Drug use: Yes    Types: Marijuana   Sexual activity: Yes    Partners: Female    Birth control/protection: Condom  Other Topics Concern   Not on file  Social History Narrative   Not on file   Social Drivers of Health   Financial Resource Strain: Not on file  Food Insecurity: Not on file  Transportation Needs: Not on file  Physical Activity: Not on file  Stress: Not on file  Social Connections: Not on file    Allergies: No Known Allergies  Metabolic Disorder Labs: No results found for: HGBA1C, MPG No results found for: PROLACTIN No results found for: CHOL, TRIG, HDL, CHOLHDL, VLDL, LDLCALC No results found for: TSH  Therapeutic Level Labs: No results found for: LITHIUM No results found for: VALPROATE No results found for: CBMZ  Current Medications: Current Outpatient Medications  Medication Sig Dispense Refill   acetaminophen  (TYLENOL ) 500 MG tablet Take by mouth as needed.     albuterol  (VENTOLIN  HFA) 108 (90 Base) MCG/ACT inhaler Inhale 2 puffs into the lungs every 6 (six) hours as needed for wheezing or shortness of breath. 8 g 2   FLUoxetine  (PROZAC ) 10 MG capsule Take 1 capsule (10 mg total) by mouth daily. 45 capsule 0   gabapentin  (NEURONTIN ) 300 MG capsule Take 1 capsule (300 mg total) by mouth 3 (three) times daily. 90 capsule 2   hydrOXYzine  (ATARAX ) 25 MG tablet Take 1 tablet (25 mg total) by mouth 3 (three) times daily as needed. 90 tablet 2   losartan (COZAAR) 50 MG  tablet Take 50 mg by mouth daily.     montelukast (SINGULAIR) 10 MG tablet Take 10 mg by mouth daily.     pantoprazole (PROTONIX) 40 MG tablet Take 40 mg by mouth 2 (two) times daily.     predniSONE  (DELTASONE ) 10 MG tablet Take 1 tablet (10 mg total) by mouth daily. Day 1-3: take 4 tablets PO daily Day 4-6: take 3 tablets PO daily Day 7-9: take 2 tablets PO daily Day 10-12: take 1 tablet PO daily 30 tablet 0   No current facility-administered medications for this visit.     Musculoskeletal: Strength & Muscle Tone: within normal limits Gait & Station: normal Patient leans: N/A   Psychiatric Specialty Exam: Review of Systems  Constitutional: Negative.   HENT: Negative.    Eyes: Negative.   Respiratory: Negative.    Cardiovascular: Negative.   Gastrointestinal: Negative.   Endocrine: Negative.   Genitourinary: Negative.   Musculoskeletal:  Negative.   Skin: Negative.   Allergic/Immunologic: Negative.   Neurological: Negative.   Hematological: Negative.   Psychiatric/Behavioral:  Positive for dysphoric mood. The patient is nervous/anxious.      There were no vitals taken for this visit.There is no height or weight on file to calculate BMI.  General Appearance: Well Groomed  Eye Contact:  Good  Speech:  Clear and Coherent  Volume:  Normal  Mood:  Euthymic  Affect:  Appropriate  Thought Process:  Coherent  Orientation:  Full (Time, Place, and Person)  Thought Content: Logical   Suicidal Thoughts:  No  Homicidal Thoughts:  No  Memory:  Immediate;   Good Recent;   Good Remote;   Good  Judgement:  Good  Insight:  Good  Psychomotor Activity:  Normal  Concentration:  Concentration: Good and Attention Span: Good  Recall:  Good  Fund of Knowledge: Good  Language: Good  Akathisia:  No  Handed:  Right  AIMS (if indicated):   Assets:  Desire for Improvement Financial Resources/Insurance Housing  ADL's:  Intact  Cognition: WNL  Sleep:  Good   Screenings: PHQ2-9     Flowsheet Row Office Visit from 09[facility]28/2023 in [facility] Office Visit from 08/25/2023 in [facility]  PHQ-2 Total Score 0 4  PHQ-9 Total Score -- 15   Flowsheet Row ED from 12/13/2023 in [facility] Visit from 09/28/2023 in [facility] Office Visit from 08/25/2023 in [facility]  C-SSRS RISK CATEGORY No Risk No Risk No Risk     Assessment and Plan:  Assessment - Diagnosis: Generalized anxiety disorder [F41.1]  2. Recurrent major depressive disorder, in full remission (HCC) [F33.42]  3. Substance use disorder [F19.90]  Differential Diagnosis: Mood Disorder  - Progress: Patient reports improvement in his depression stating that he stopped Effexor  due to side effects and stated that he seems to have realize he is doing fine on gabapentin  and hydroxyzine  by itself.  Patient did not bring up any other medications stating that he feels that he is and try to do with less in which this provider that is supportive.  Patient has been educated to reach out to the clinic should he have any symptoms prior to the next appointment as, follow up in 3 months - Risk Factors: Suicide risk, worsening symptoms  Plan - Medications:  Continue gabapentin  300 mg TID for anxiety.  Start Fluoxetine  10mg  once daily for 2 weeks.  - Psychotherapy: Therapist weekly.  - Education: Patient has been educated on the substance use stating that the use of marijuana daily as well as alcohol use daily with the positive CAGE assessment is recommended to stop use while on therapy due to psychoactive nature of substances.  Patient has also been educated on the clinic policy on benzos and has been told that he we will not represcribe Klonopin and for the patient to stop using the medication.  Patient is in agreement and  states that he would like to cut down and understands that he should be participating in therapy and has been educated on IOP for substance use, and instructed to let provider know if he is interested in the program. - Follow-Up: Patient will follow-up in 3 months - Referrals: No referrals - Safety Planning:  The patient has been educated, if they should have suicidal thoughts with or without a plan to call 911, or  go to the closest emergency department.  Pt verbalized understanding.  Pt denies firearms within the home.  Pt also agrees to call the clinic should they have worsening symptoms before the next appointment.      Patient/Guardian was advised Release of Information must be obtained prior to any record release in order to collaborate their care with an outside provider. Patient/Guardian was advised if they have not already done so to contact the registration department to sign all necessary forms in order for us  to release information regarding their care.   Consent: Patient/Guardian gives verbal consent for treatment and assignment of benefits for services provided during this visit. Patient/Guardian expressed understanding and agreed to proceed.    Dorn Jama Der, NP 04/09/2024, 1:32 PM

## 2024-04-23 ENCOUNTER — Telehealth: Admitting: Psychiatry

## 2024-04-23 DIAGNOSIS — F411 Generalized anxiety disorder: Secondary | ICD-10-CM

## 2024-04-23 DIAGNOSIS — F331 Major depressive disorder, recurrent, moderate: Secondary | ICD-10-CM

## 2024-04-23 DIAGNOSIS — F1990 Other psychoactive substance use, unspecified, uncomplicated: Secondary | ICD-10-CM

## 2024-04-23 MED ORDER — FLUOXETINE HCL 20 MG PO CAPS: 20.0000 mg | ORAL_CAPSULE | Freq: Every day | ORAL | 1 refills | Status: AC

## 2024-04-23 NOTE — Progress Notes (Signed)
 BH MD/PA/NP OP Progress Note  04/23/2024 1:05 PM Garrett Adams  MRN:  [id number]  Chief Complaint: routine followup Virtual Visit via Video Note  I connected with Garrett Adams on 04/23/24 at  1:00 PM EST by a video enabled telemedicine application and verified that I am speaking with the correct person using two identifiers.  Location: Patient: [address]  [place] [address]  Provider: [facility] Office of Provider   I discussed the limitations of evaluation and management by telemedicine and the availability of in person appointments. The patient expressed understanding and agreed to proceed.    I discussed the assessment and treatment plan with the patient. The patient was provided an opportunity to ask questions and all were answered. The patient agreed with the plan and demonstrated an understanding of the instructions.   The patient was advised to call back or seek an in-person evaluation if the symptoms worsen or if the condition fails to improve as anticipated.  I provided 30 minutes of non-face-to-face time during this encounter.   Dorn Jama Der, NP   HPI: 33 year old male presenting ARPA for follow-up.  Patient reports that he has been taking the fluoxetine  with the last several days and reports great improvement with his anxiety and depression.  Patient reports that he is happy with current medication management currently is taking fluoxetine  20 mg once daily.  Patient reports that life is going well and he is has no big stressors but states that he is continuing taking gabapentin  and fluoxetine  to help manage his anxiety and depression.  Patient reports no new life stressors.  Patient endorses having good holidays stating that he is having a good time with his family and has not many big stressors at this time.  Patient is in agreement treatment plan.  Patient is going to be transferred to another provider here at the clinic.  Patient with no other  question concerns.  Patient denies SI, HI, AVH.  Patient follow-up in 3 months. Visit Diagnosis:    ICD-10-CM   1. Generalized anxiety disorder  F41.1     2. Major depressive disorder, recurrent episode, moderate (HCC)  F33.1       Past Psychiatric History:  Previous Psych Hospitalizations: -2019, admitted as rehab for detox off Suboxone.  Patient was significant history of substance use including heroin and LSD 4 years ago ecstasy 5 years ago Suboxone this past year as well as use of cocaine 1 year ago and cannabis daily and alcohol daily 6 cans a day.   Outpatient treatment: Currently diagnosed with anxiety and depression.   Medications Current: -Gabapentin  300 mg TID for anxiety     Next Steps:  - Currently on maintenance, pt next appointment in 3 months.    Medication Trials: -09/28/23 Effexor , pt reports numbing, felt like a zombie.  -2019 Zoloft, poor response was taking with substance use -2019 citalopram, poor response with substance use -2019 Prozac , poor response with substance use -2019 buspirone, poor response with substance use -2019 Remeron, poor response with substance use   Suicide & Violence: Denies SI, HI, AVH.  Patient also denies previous suicide attempts.   Psychotherapy: Patient currently recommended for psychotherapy with Almarie at [facility] patient placed on waiting list.   Legal: Denies current any legal iss  Past Medical History:  Past Medical History:  Diagnosis Date   Hypertension    Seizures (HCC)    No past surgical history on file.  Family Psychiatric History: No  additional  Family History:  Family History  Problem Relation Age of Onset   Diabetes Maternal Grandfather    Alzheimer's disease Maternal Grandfather    Parkinson's disease Maternal Grandfather    Neuropathy Maternal Grandfather    High Cholesterol Maternal Grandfather    Diabetes Paternal Grandfather    Cancer Paternal Grandfather     Social History:  Social History    Socioeconomic History   Marital status: Single    Spouse name: Not on file   Number of children: 2   Years of education: Not on file   Highest education level: Some college, no degree  Occupational History   Occupation: food press photographer  Tobacco Use   Smoking status: Every Day    Current packs/day: 1.00    Types: Cigarettes   Smokeless tobacco: Never  Vaping Use   Vaping status: Never Used  Substance and Sexual Activity   Alcohol use: Yes    Comment: daily   Drug use: Yes    Types: Marijuana   Sexual activity: Yes    Partners: Female    Birth control/protection: Condom  Other Topics Concern   Not on file  Social History Narrative   Not on file   Social Drivers of Health   Financial Resource Strain: Not on file  Food Insecurity: Not on file  Transportation Needs: Not on file  Physical Activity: Not on file  Stress: Not on file  Social Connections: Not on file    Allergies: No Known Allergies  Metabolic Disorder Labs: No results found for: HGBA1C, MPG No results found for: PROLACTIN No results found for: CHOL, TRIG, HDL, CHOLHDL, VLDL, LDLCALC No results found for: TSH  Therapeutic Level Labs: No results found for: LITHIUM No results found for: VALPROATE No results found for: CBMZ  Current Medications: Current Outpatient Medications  Medication Sig Dispense Refill   acetaminophen  (TYLENOL ) 500 MG tablet Take by mouth as needed.     albuterol  (VENTOLIN  HFA) 108 (90 Base) MCG/ACT inhaler Inhale 2 puffs into the lungs every 6 (six) hours as needed for wheezing or shortness of breath. 8 g 2   FLUoxetine  (PROZAC ) 10 MG capsule Take 1 capsule (10 mg total) by mouth daily. 45 capsule 0   gabapentin  (NEURONTIN ) 300 MG capsule Take 1 capsule (300 mg total) by mouth 3 (three) times daily. 90 capsule 2   hydrOXYzine  (ATARAX ) 25 MG tablet Take 1 tablet (25 mg total) by mouth 3 (three) times daily as needed. 90 tablet 2   losartan (COZAAR) 50 MG  tablet Take 50 mg by mouth daily.     montelukast (SINGULAIR) 10 MG tablet Take 10 mg by mouth daily.     pantoprazole (PROTONIX) 40 MG tablet Take 40 mg by mouth 2 (two) times daily.     predniSONE  (DELTASONE ) 10 MG tablet Take 1 tablet (10 mg total) by mouth daily. Day 1-3: take 4 tablets PO daily Day 4-6: take 3 tablets PO daily Day 7-9: take 2 tablets PO daily Day 10-12: take 1 tablet PO daily 30 tablet 0   No current facility-administered medications for this visit.     Musculoskeletal: Strength & Muscle Tone: within normal limits Gait & Station: normal Patient leans: N/A   Psychiatric Specialty Exam: Review of Systems  Constitutional: Negative.   HENT: Negative.    Eyes: Negative.   Respiratory: Negative.    Cardiovascular: Negative.   Gastrointestinal: Negative.   Endocrine: Negative.   Genitourinary: Negative.   Musculoskeletal: Negative.   Skin:  Negative.   Allergic/Immunologic: Negative.   Neurological: Negative.   Hematological: Negative.   Psychiatric/Behavioral:  Positive for dysphoric mood. The patient is nervous/anxious.      There were no vitals taken for this visit.There is no height or weight on file to calculate BMI.  General Appearance: Well Groomed  Eye Contact:  Good  Speech:  Clear and Coherent  Volume:  Normal  Mood:  Euthymic  Affect:  Appropriate  Thought Process:  Coherent  Orientation:  Full (Time, Place, and Person)  Thought Content: Logical   Suicidal Thoughts:  No  Homicidal Thoughts:  No  Memory:  Immediate;   Good Recent;   Good Remote;   Good  Judgement:  Good  Insight:  Good  Psychomotor Activity:  Normal  Concentration:  Concentration: Good and Attention Span: Good  Recall:  Good  Fund of Knowledge: Good  Language: Good  Akathisia:  No  Handed:  Right  AIMS (if indicated):   Assets:  Desire for Improvement Financial Resources/Insurance Housing  ADL's:  Intact  Cognition: WNL  Sleep:  Good   Screenings: PHQ2-9     Flowsheet Row Office Visit from 09/28/2023 in [facility] Office Visit from 08/25/2023 in [facility]  PHQ-2 Total Score 0 4  PHQ-9 Total Score -- 15   Flowsheet Row ED from 12/13/2023 in [facility] Visit from 09/28/2023 in [facility] Office Visit from 08/25/2023 in [facility]  C-SSRS RISK CATEGORY No Risk No Risk No Risk     Assessment and Plan:  Assessment - Diagnosis: Generalized anxiety disorder [F41.1]  2. Recurrent major depressive disorder, in full remission (HCC) [F33.42]  3. Substance use disorder [F19.90]  Differential Diagnosis: Mood Disorder  - Progress: Patient reports improvement in his depression stating that he stopped Effexor  due to side effects and stated that he seems to have realize he is doing fine on gabapentin  and hydroxyzine  by itself.  Patient did not bring up any other medications stating that he feels that he is and try to do with less in which this provider that is supportive.  Patient has been educated to reach out to the clinic should he have any symptoms prior to the next appointment as, follow up in 3 months - Risk Factors: Suicide risk, worsening symptoms  Plan - Medications:  Continue gabapentin  300 mg TID for anxiety.  Continue Fluoxetine  20mg  once daily.  - Psychotherapy: Therapist weekly.  - Education: Patient has been educated on the substance use stating that the use of marijuana daily as well as alcohol use daily with the positive CAGE assessment is recommended to stop use while on therapy due to psychoactive nature of substances.  Patient has also been educated on the clinic policy on benzos and has been told that he we will not represcribe Klonopin and for the patient to stop using the medication.  Patient is in agreement and states that  he would like to cut down and understands that he should be participating in therapy and has been educated on IOP for substance use, and instructed to let provider know if he is interested in the program. - Follow-Up: Patient will follow-up in 3 months - Referrals: No referrals - Safety Planning:  The patient has been educated, if they should have suicidal thoughts with or without a plan to call 911, or go to the closest emergency department.  Pt verbalized understanding.  Pt denies firearms within the home.  Pt also agrees to call the clinic should they have worsening symptoms before the next appointment.  Patient/Guardian was advised Release of Information must be obtained prior to any record release in order to collaborate their care with an outside provider. Patient/Guardian was advised if they have not already done so to contact the registration department to sign all necessary forms in order for us  to release information regarding their care.   Consent: Patient/Guardian gives verbal consent for treatment and assignment of benefits for services provided during this visit. Patient/Guardian expressed understanding and agreed to proceed.    Dorn Jama Der, NP 04/23/2024, 1:05 PM

## 2024-04-30 ENCOUNTER — Telehealth: Payer: Self-pay

## 2024-04-30 NOTE — Telephone Encounter (Signed)
 Pt calling requesting,gabapentin  gabapentin  (NEURONTIN ) 300 MG capsule To be sent to [id number] - [place], [place] - [address] AT [facility]  Last apt 04/23/24 Next apt 06/12/24

## 2024-05-01 ENCOUNTER — Other Ambulatory Visit: Payer: Self-pay | Admitting: Psychiatry

## 2024-05-01 DIAGNOSIS — F411 Generalized anxiety disorder: Secondary | ICD-10-CM

## 2024-05-01 MED ORDER — GABAPENTIN 300 MG PO CAPS: 300.0000 mg | ORAL_CAPSULE | Freq: Three times a day (TID) | ORAL | 2 refills | Status: AC

## 2024-06-12 ENCOUNTER — Ambulatory Visit: Admitting: Psychiatry

## 2024-06-28 ENCOUNTER — Ambulatory Visit: Admitting: Psychiatry
